# Patient Record
Sex: Female | Born: 1935 | Hispanic: No | Marital: Single | Smoking: Never smoker
Health system: Southern US, Community
[De-identification: ages and names within clinical notes are randomized; demographics above are authoritative.]

---

## 2012-03-04 DIAGNOSIS — R6889 Other general symptoms and signs: Secondary | ICD-10-CM | POA: Diagnosis not present

## 2012-03-04 DIAGNOSIS — R51 Headache: Secondary | ICD-10-CM | POA: Diagnosis not present

## 2012-03-04 DIAGNOSIS — M542 Cervicalgia: Secondary | ICD-10-CM | POA: Diagnosis not present

## 2012-03-05 ENCOUNTER — Emergency Department (HOSPITAL_COMMUNITY): Payer: No Typology Code available for payment source

## 2012-03-05 ENCOUNTER — Encounter (HOSPITAL_COMMUNITY): Payer: Self-pay | Admitting: Neurology

## 2012-03-05 ENCOUNTER — Emergency Department (HOSPITAL_COMMUNITY)
Admission: EM | Admit: 2012-03-05 | Discharge: 2012-03-05 | Disposition: A | Discharge status: Home / Self Care | Payer: No Typology Code available for payment source | Attending: Emergency Medicine | Admitting: Emergency Medicine

## 2012-03-05 DIAGNOSIS — M79609 Pain in unspecified limb: Secondary | ICD-10-CM | POA: Diagnosis not present

## 2012-03-05 DIAGNOSIS — M25519 Pain in unspecified shoulder: Secondary | ICD-10-CM | POA: Diagnosis not present

## 2012-03-05 DIAGNOSIS — R109 Unspecified abdominal pain: Secondary | ICD-10-CM | POA: Insufficient documentation

## 2012-03-05 DIAGNOSIS — T148XXA Other injury of unspecified body region, initial encounter: Secondary | ICD-10-CM | POA: Diagnosis not present

## 2012-03-05 DIAGNOSIS — Y92410 Unspecified street and highway as the place of occurrence of the external cause: Secondary | ICD-10-CM | POA: Insufficient documentation

## 2012-03-05 DIAGNOSIS — R51 Headache: Secondary | ICD-10-CM | POA: Insufficient documentation

## 2012-03-05 DIAGNOSIS — M542 Cervicalgia: Secondary | ICD-10-CM | POA: Diagnosis not present

## 2012-03-05 MED ORDER — HYDROCODONE-ACETAMINOPHEN 5-500 MG PO TABS: 1.0000 | ORAL_TABLET | Freq: Four times a day (QID) | ORAL | Status: AC | PRN

## 2012-03-05 MED ORDER — OXYCODONE-ACETAMINOPHEN 5-325 MG PO TABS
2.0000 | ORAL_TABLET | Freq: Once | ORAL | Status: AC
  Administered 2012-03-05: 2 via ORAL
  Filled 2012-03-05: qty 2
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
## 2012-03-05 NOTE — ED Notes (Signed)
Pt ambulated with steady slow gait.
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
## 2012-03-05 NOTE — ED Notes (Signed)
Patient transported from X-ray and CT 

## 2012-03-05 NOTE — ED Notes (Signed)
Patient transported to CT 

## 2012-04-02 ENCOUNTER — Ambulatory Visit (INDEPENDENT_AMBULATORY_CARE_PROVIDER_SITE_OTHER): Payer: Medicare Other | Admitting: Internal Medicine

## 2012-04-02 ENCOUNTER — Encounter: Payer: Self-pay | Admitting: Internal Medicine

## 2012-04-02 VITALS — BP 126/75 | HR 65 | Temp 97.9°F | Ht 58.5 in | Wt 120.4 lb

## 2012-04-02 DIAGNOSIS — R5381 Other malaise: Secondary | ICD-10-CM

## 2012-04-02 DIAGNOSIS — M779 Enthesopathy, unspecified: Secondary | ICD-10-CM | POA: Diagnosis not present

## 2012-04-02 DIAGNOSIS — H259 Unspecified age-related cataract: Secondary | ICD-10-CM

## 2012-04-02 DIAGNOSIS — M67919 Unspecified disorder of synovium and tendon, unspecified shoulder: Secondary | ICD-10-CM

## 2012-04-02 DIAGNOSIS — M7581 Other shoulder lesions, right shoulder: Secondary | ICD-10-CM | POA: Insufficient documentation

## 2012-04-02 DIAGNOSIS — R5383 Other fatigue: Secondary | ICD-10-CM

## 2012-04-02 DIAGNOSIS — Z0000 Encounter for general adult medical examination without abnormal findings: Secondary | ICD-10-CM | POA: Diagnosis not present

## 2012-04-02 DIAGNOSIS — Z136 Encounter for screening for cardiovascular disorders: Secondary | ICD-10-CM

## 2012-04-02 LAB — CBC
Hemoglobin: 12.7 g/dL (ref 12.0–15.0)
MCHC: 33 g/dL (ref 30.0–36.0)
MCV: 86.7 fL (ref 78.0–100.0)
RBC: 4.44 MIL/uL (ref 3.87–5.11)
WBC: 4.4 10*3/uL (ref 4.0–10.5)

## 2012-04-02 MED ORDER — IBUPROFEN 600 MG PO TABS: 600.0000 mg | ORAL_TABLET | Freq: Four times a day (QID) | ORAL | Status: DC | PRN

## 2012-04-02 MED ORDER — CYCLOBENZAPRINE HCL 5 MG PO TABS: 5.0000 mg | ORAL_TABLET | Freq: Three times a day (TID) | ORAL | Status: DC | PRN

## 2012-04-02 NOTE — Progress Notes (Signed)
INTERNAL MEDICINE TEACHING ATTENDING ADDENDUM Lonzo Cloud , MD: I personally saw and evaluated Ms.Carattini  in this clinic visit in conjunction with the resident, Dr. Burtis Junes. I have discussed the patient's plan of care with Dr. Burtis Junes during this visit. I have confirmed the physical exam findings and have read and agree with the clinic note including the plan.
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
## 2012-04-03 LAB — LIPID PANEL
HDL: 60 mg/dL (ref >39)
LDL Cholesterol: 130 mg/dL — ABNORMAL HIGH (ref 0–99)
VLDL: 12 mg/dL (ref 0–40)

## 2012-04-03 LAB — COMPLETE METABOLIC PANEL WITH GFR
ALT: 13 U/L (ref 0–35)
BUN: 17 mg/dL (ref 6–23)
CO2: 28 mEq/L (ref 19–32)
Chloride: 102 mEq/L (ref 96–112)
GFR, Est African American: >89 mL/min
GFR, Est Non African American: 84 mL/min
Glucose, Bld: 81 mg/dL (ref 70–99)
Potassium: 5.4 mEq/L — ABNORMAL HIGH (ref 3.5–5.3)
Sodium: 138 mEq/L (ref 135–145)
Total Bilirubin: 0.3 mg/dL (ref 0.3–1.2)
Total Protein: 6.9 g/dL (ref 6.0–8.3)

## 2012-04-03 LAB — TSH: TSH: 1.515 u[IU]/mL (ref 0.350–4.500)

## 2012-06-08 ENCOUNTER — Ambulatory Visit: Payer: Medicare Other

## 2012-06-22 ENCOUNTER — Ambulatory Visit: Payer: Medicare Other | Attending: Internal Medicine | Admitting: Physical Therapy

## 2012-06-22 DIAGNOSIS — IMO0001 Reserved for inherently not codable concepts without codable children: Secondary | ICD-10-CM | POA: Insufficient documentation

## 2012-06-22 DIAGNOSIS — M542 Cervicalgia: Secondary | ICD-10-CM | POA: Diagnosis not present

## 2012-06-22 DIAGNOSIS — M546 Pain in thoracic spine: Secondary | ICD-10-CM | POA: Diagnosis not present

## 2012-06-25 ENCOUNTER — Ambulatory Visit: Payer: Medicare Other | Admitting: Physical Therapy

## 2012-06-29 ENCOUNTER — Ambulatory Visit: Payer: Medicare Other | Admitting: Physical Therapy

## 2012-07-01 ENCOUNTER — Ambulatory Visit: Payer: Medicare Other | Admitting: Physical Therapy

## 2012-07-13 ENCOUNTER — Ambulatory Visit: Payer: Medicare Other | Admitting: Physical Therapy

## 2012-07-13 ENCOUNTER — Encounter: Payer: Medicare Other | Admitting: Physical Therapy

## 2012-07-16 ENCOUNTER — Ambulatory Visit (INDEPENDENT_AMBULATORY_CARE_PROVIDER_SITE_OTHER): Payer: Medicare Other | Admitting: Internal Medicine

## 2012-07-16 ENCOUNTER — Encounter: Payer: Self-pay | Admitting: Internal Medicine

## 2012-07-16 VITALS — BP 135/75 | HR 76 | Temp 96.9°F | Ht <= 58 in | Wt 123.1 lb

## 2012-07-16 DIAGNOSIS — E785 Hyperlipidemia, unspecified: Secondary | ICD-10-CM

## 2012-07-16 DIAGNOSIS — Z0000 Encounter for general adult medical examination without abnormal findings: Secondary | ICD-10-CM | POA: Diagnosis not present

## 2012-07-16 DIAGNOSIS — IMO0002 Reserved for concepts with insufficient information to code with codable children: Secondary | ICD-10-CM

## 2012-07-16 DIAGNOSIS — Z23 Encounter for immunization: Secondary | ICD-10-CM

## 2012-07-16 DIAGNOSIS — S46912A Strain of unspecified muscle, fascia and tendon at shoulder and upper arm level, left arm, initial encounter: Secondary | ICD-10-CM

## 2012-07-16 MED ORDER — ATORVASTATIN CALCIUM 10 MG PO TABS: 10.0000 mg | ORAL_TABLET | Freq: Every day | ORAL | Status: DC

## 2012-07-16 MED ORDER — IBUPROFEN 600 MG PO TABS: 600.0000 mg | ORAL_TABLET | Freq: Four times a day (QID) | ORAL | Status: DC | PRN

## 2012-07-16 MED ORDER — CYCLOBENZAPRINE HCL 5 MG PO TABS: 5.0000 mg | ORAL_TABLET | Freq: Three times a day (TID) | ORAL | Status: DC | PRN

## 2012-07-16 NOTE — Addendum Note (Signed)
Addended by: Carolan Clines on: 07/16/2012 02:13 PM   Modules accepted: Level of Service
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
## 2012-07-20 ENCOUNTER — Ambulatory Visit: Payer: Medicare Other | Attending: Internal Medicine | Admitting: Physical Therapy

## 2012-07-20 DIAGNOSIS — M542 Cervicalgia: Secondary | ICD-10-CM | POA: Insufficient documentation

## 2012-07-20 DIAGNOSIS — M546 Pain in thoracic spine: Secondary | ICD-10-CM | POA: Diagnosis not present

## 2012-07-20 DIAGNOSIS — IMO0001 Reserved for inherently not codable concepts without codable children: Secondary | ICD-10-CM | POA: Insufficient documentation

## 2012-07-27 ENCOUNTER — Ambulatory Visit: Payer: Medicare Other | Admitting: Physical Therapy

## 2012-07-27 DIAGNOSIS — R51 Headache: Secondary | ICD-10-CM | POA: Diagnosis not present

## 2012-07-27 DIAGNOSIS — R079 Chest pain, unspecified: Secondary | ICD-10-CM | POA: Diagnosis not present

## 2014-02-01 DIAGNOSIS — M25569 Pain in unspecified knee: Secondary | ICD-10-CM | POA: Diagnosis not present

## 2014-04-26 DIAGNOSIS — G4489 Other headache syndrome: Secondary | ICD-10-CM | POA: Diagnosis not present

## 2014-04-26 DIAGNOSIS — S138XXA Sprain of joints and ligaments of other parts of neck, initial encounter: Secondary | ICD-10-CM | POA: Diagnosis not present

## 2014-05-09 ENCOUNTER — Emergency Department (HOSPITAL_COMMUNITY)
Admission: EM | Admit: 2014-05-09 | Discharge: 2014-05-10 | Disposition: A | Discharge status: Home / Self Care | Payer: Medicare Other | Attending: Emergency Medicine | Admitting: Emergency Medicine

## 2014-05-09 ENCOUNTER — Encounter (HOSPITAL_COMMUNITY): Payer: Self-pay | Admitting: Emergency Medicine

## 2014-05-09 DIAGNOSIS — M546 Pain in thoracic spine: Secondary | ICD-10-CM | POA: Insufficient documentation

## 2014-05-09 DIAGNOSIS — M545 Low back pain: Secondary | ICD-10-CM | POA: Insufficient documentation

## 2014-05-09 DIAGNOSIS — R51 Headache: Secondary | ICD-10-CM | POA: Diagnosis not present

## 2014-05-09 DIAGNOSIS — M549 Dorsalgia, unspecified: Secondary | ICD-10-CM | POA: Diagnosis not present

## 2014-05-09 DIAGNOSIS — M542 Cervicalgia: Secondary | ICD-10-CM | POA: Diagnosis not present

## 2014-05-09 DIAGNOSIS — H269 Unspecified cataract: Secondary | ICD-10-CM | POA: Insufficient documentation

## 2014-05-09 DIAGNOSIS — G8911 Acute pain due to trauma: Secondary | ICD-10-CM | POA: Diagnosis not present

## 2014-05-09 DIAGNOSIS — R519 Headache, unspecified: Secondary | ICD-10-CM

## 2014-05-09 DIAGNOSIS — S3992XA Unspecified injury of lower back, initial encounter: Secondary | ICD-10-CM | POA: Diagnosis not present

## 2014-05-09 DIAGNOSIS — S299XXA Unspecified injury of thorax, initial encounter: Secondary | ICD-10-CM | POA: Diagnosis not present

## 2014-05-09 DIAGNOSIS — W19XXXA Unspecified fall, initial encounter: Secondary | ICD-10-CM

## 2014-05-09 DIAGNOSIS — I10 Essential (primary) hypertension: Secondary | ICD-10-CM | POA: Diagnosis not present
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
## 2014-05-10 ENCOUNTER — Emergency Department (HOSPITAL_COMMUNITY): Payer: Medicare Other

## 2014-05-10 DIAGNOSIS — M549 Dorsalgia, unspecified: Secondary | ICD-10-CM | POA: Diagnosis not present

## 2014-05-10 DIAGNOSIS — M546 Pain in thoracic spine: Secondary | ICD-10-CM | POA: Diagnosis not present

## 2014-05-10 DIAGNOSIS — S3992XA Unspecified injury of lower back, initial encounter: Secondary | ICD-10-CM | POA: Diagnosis not present

## 2014-05-10 DIAGNOSIS — S299XXA Unspecified injury of thorax, initial encounter: Secondary | ICD-10-CM | POA: Diagnosis not present

## 2014-05-10 DIAGNOSIS — R51 Headache: Secondary | ICD-10-CM | POA: Diagnosis not present

## 2014-05-10 MED ORDER — TRAMADOL HCL 50 MG PO TABS: 50.0000 mg | ORAL_TABLET | Freq: Four times a day (QID) | ORAL | Status: DC | PRN

## 2014-05-10 MED ORDER — OXYCODONE-ACETAMINOPHEN 5-325 MG PO TABS
1.0000 | ORAL_TABLET | Freq: Once | ORAL | Status: AC
  Administered 2014-05-10: 1 via ORAL
  Filled 2014-05-10: qty 1

## 2014-05-10 NOTE — ED Provider Notes (Signed)
CSN: [id number]     Arrival date & time 05/09/14  2150 History   First MD Initiated Contact with Patient 05/10/14 0004     Chief Complaint  Patient presents with  . Headache  . Back Pain     (Consider location/radiation/quality/duration/timing/severity/associated sxs/prior Treatment) HPI Interpreter used, [age] year old female [facility] weeks ago slipped on a wet floor did not fall but has had gradual onset constant headache and neck pain and back pain since that time, [facility] [place] [facility] for the same pain as well as urgent [place], she has been using Tylenol and ibuprofen without improvement of her pain, she has no weakness no change in bowel or bladder function no change in speech or vision or swallowing and no numbness except that for the last 2 mornings when she first woke up for just a few minutes she felt as if both hands might be slightly numb but that has not recurred throughout the day at all, her pain is to the posterior aspect of her head neck and entire back with radiation of her back pain down her legs to her feet 24 hours a day worse with palpation and movement. She has no fever no cough no chest pain no shortness of breath no abdominal pain and no other concerns. All of her pains are gradual onset moderately severe. Past Medical History  Diagnosis Date  . Cataract    History reviewed. No pertinent past surgical history. Family History  Problem Relation Age of Onset  . Hypertension Mother    History  Substance Use Topics  . Smoking status: Never Smoker   . Smokeless tobacco: Not on file  . Alcohol Use: No   OB History    No data available     Review of Systems 10 Systems reviewed and are negative for acute change except as noted in the HPI.   Allergies  Review of patient's allergies indicates no known allergies.  Home Medications   Prior to Admission medications   Medication Sig Start Date End Date Taking?  Authorizing Provider  acetaminophen (TYLENOL) 325 MG tablet Take 650 mg by mouth every 6 (six) hours as needed.   Yes Historical Provider, MD  traMADol (ULTRAM) 50 MG tablet Take 1 tablet (50 mg total) by mouth every 6 (six) hours as needed for severe pain. 05/10/14   Hurman HornJohn M Venesa Semidey, MD   BP 130/67 mmHg  Pulse 60  Temp(Src) 98.8 F (37.1 C) (Oral)  Resp 21  SpO2 99% Physical Exam  Nursing note and vitals reviewed. Constitutional:  Awake, alert, nontoxic appearance with baseline speech for patient.  HENT:  Head: Atraumatic.  Mouth/Throat: No oropharyngeal exudate.  Eyes: EOM are normal. Pupils are equal, round, and reactive to light. Right eye exhibits no discharge. Left eye exhibits no discharge.  Neck: Neck supple.  Diffusely tender posterior neck including cervical spine as well as entire back over thoracic spine and lumbar spine and parathoracic region paralumbar region  Cardiovascular: Normal rate and regular rhythm.   No murmur heard. Pulmonary/Chest: Effort normal and breath sounds normal. No stridor. No respiratory distress. She has no wheezes. She has no rales. She exhibits no tenderness.  Abdominal: Soft. Bowel sounds are normal. She exhibits no mass. There is no tenderness. There is no rebound.  Musculoskeletal: She exhibits no tenderness.  Baseline ROM, moves extremities with no obvious new focal weakness.  Lymphadenopathy:    She has no cervical adenopathy.  Neurological: She is alert.  Awake, alert, cooperative and aware of situation; motor strength 5/5 bilaterally; sensation normal to light touch bilaterally; peripheral visual fields full to confrontation; no facial asymmetry; tongue midline; major cranial nerves appear intact; no pronator drift, normal finger to nose bilaterally, baseline gait without new ataxia.  Skin: No rash noted.  Psychiatric: She has a normal mood and affect.    ED Course  Procedures (including critical care time) CT C-Spine no fracture  [id number] in [place] [facility]. Head CT and T/L plain films ordered. 0200   Labs Review Labs Reviewed - No data to display  Imaging Review No results found.   EKG Interpretation None      MDM   Final diagnoses:  Fall  Headache  Back pain  Neck pain    Patient / Family / Caregiver informed of clinical course, understand medical decision-making process, and agree with plan. I doubt any other EMC precluding discharge at this time including, but not necessarily limited to the following:ICH, cord syndrome.    Hurman HornJohn M Juanantonio Stolar, MD 05/14/14 [phone]

## 2014-05-10 NOTE — Discharge Instructions (Signed)
You have neck pain, possibly from a cervical strain and/or pinched nerve.  SEEK IMMEDIATE MEDICAL ATTENTION IF: You develop difficulties swallowing or breathing.  You have new or worse numbness, weakness, tingling, or movement problems in your arms or legs.  You develop increasing pain which is uncontrolled with medications.  You have change in bowel or bladder function, or other concerns.  SEEK IMMEDIATE MEDICAL ATTENTION IF: New numbness, tingling, weakness, or problem with the use of your arms or legs.  Severe back pain not relieved with medications.  Change in bowel or bladder control.  Increasing pain in any areas of the body (such as chest or abdominal pain).  Shortness of breath, dizziness or fainting.  Nausea (feeling sick to your stomach), vomiting, fever, or sweats.  You are having a headache. No specific cause was found today for your headache. It may have been a migraine or other cause of headache. Stress, anxiety, fatigue, and depression are common triggers for headaches. Your headache today does not appear to be life-threatening or require hospitalization, but often the exact cause of headaches is not determined in the emergency department. Therefore, follow-up with your doctor is very important to find out what may have caused your headache, and whether or not you need any further diagnostic testing or treatment. Sometimes headaches can appear benign (not harmful), but then more serious symptoms can develop which should prompt an immediate re-evaluation by your doctor or the emergency department. SEEK MEDICAL ATTENTION IF: You develop possible problems with medications prescribed.  The medications don't resolve your headache, if it recurs , or if you have multiple episodes of vomiting or can't take fluids. You have a change from the usual headache. RETURN IMMEDIATELY IF you develop a sudden, severe headache or confusion, become poorly responsive or faint, develop a fever above  100.41F or problem breathing, have a change in speech, vision, swallowing, or understanding, or develop new weakness, numbness, tingling, incoordination, or have a seizure.  Emergency Department Resource Guide 1) Find a Doctor and Pay Out of Pocket Although you won't have to find out who is covered by your insurance plan, it is a good idea to ask around and get recommendations. You will then need to call the office and see if the doctor you have chosen will accept you as a new patient and what types of options they offer for patients who are self-pay. Some doctors offer discounts or will set up payment plans for their patients who do not have insurance, but you will need to ask so you aren't surprised when you get to your appointment.  2) Contact Your Local Health Department Not all health departments have doctors that can see patients for sick visits, but many do, so it is worth a call to see if yours does. If you don't know where your local health department is, you can check in your phone book. The CDC also has a tool to help you locate your state's health department, and many state websites also have listings of all of their local health departments.  3) Find a Walk-in Clinic If your illness is not likely to be very severe or complicated, you may want to try a walk in clinic. These are popping up all over the country in pharmacies, drugstores, and shopping centers. They're usually staffed by nurse practitioners or physician assistants that have been trained to treat common illnesses and complaints. They're usually fairly quick and inexpensive. However, if you have serious medical issues or chronic medical problems,  these are probably not your best option.  No Primary Care Doctor: - Call Health Connect at  [phone] - they can help you locate a primary care doctor that  accepts your insurance, provides certain services, etc. - Physician Referral Service- [phone]  Chronic Pain  Problems: Organization         Address  Phone   Notes  [facility]  [phone] Patients need to be referred by their primary care doctor.   Medication Assistance: Organization         Address  Phone   Notes  [facility] Medication [facility] [address] [place], [place] [address] [phone] --Must be a resident of [facility] -- Must have NO insurance coverage whatsoever (no Medicaid/ Medicare, etc.) -- The pt. MUST have a primary care doctor that directs their care regularly and follows them in the community   [facility]ist  [phone]   [employer]  [phone]    Agencies that provide inexpensive medical care: Organization         Address  Phone   Notes  [facility]  [phone]   [facility]    [phone]   [facility] [address] [place], [place] [address] [phone]   [facility] [address] [place] [place] [phone]   Planned Parenthood    [phone]   [facility]    [phone]   [facility]  [address], [place] Phone:  [phone], Fax:  [phone] Hours of Operation:  9 am - 6 pm, M-F.  Also accepts Medicaid/Medicare and self-pay.  [facility]  [address], [place] Phone: [phone], Fax: [phone]. Hours of Operation:  8:30 am - 5:30 pm, M-F.  Also accepts Medicaid and self-pay.  [facility] [place] [address] [place] Phone: [phone]   [facility] [address] Natasha Bence [place], [place] [phone], Ext. [phone] Mondays & Thursdays: 7-9 AM.  First 15 patients are seen on a first come, first serve basis.    Medicaid-accepting [facility] Providers:  Organization         Address  Phone   Notes  [facility] [address],  [phone] Also  accepts self-pay patients.  [facility] [address] Laurell Josephs [place], [place]  [phone]   [facility] [address], [place] [phone]   [facility] [address] [place] [phone]   Renaye Rakers [address], [place]   [phone] Only accepts [place] patients after they have their name applied to their card.   Self-Pay (no insurance) in [facility]:  Organization         Address  Phone   Notes  Sickle Cell Patients, [facility] [address] [place], [place] [phone]   [facility] [address] [place], [place] [phone]   [facility] [place]  [address], [place] [phone]   [facility]/Dr. Osei-Bonsu  [address] [place] or [address], [place] [phone] Phone number for both [place] and [place] locations is the same.  Urgent Medical and  [facility] [address] [place] [phone]   [facility] [address], [place] or [address] [phone]   [facility] [address], [place] [phone], phone; [phone], fax Sees patients 1st and 3rd Saturday of every month.  Must not qualify for public or private insurance (i.e. Medicaid, Medicare, [place] Health Choice, Veterans' Benefits)  Household income should be no more than 200% of the poverty level The clinic cannot treat you if you are pregnant or think you are pregnant  Sexually transmitted diseases are not treated at the clinic.   Dental Care: Organization         Address  Phone  Notes  [facility] Department of [facility] [address] [place], [place] [phone] Accepts children up to age 75 who are enrolled in [place] or [place] Health Choice; pregnant  women with a Medicaid card; and children who have applied for Medicaid or [place] Health Choice, but were declined, whose parents can pay a reduced fee at time of service.  [facility]  [address], [place] [phone] Accepts children up to age 70 who are enrolled in [place] or [place] Health Choice; pregnant women with a Medicaid card; and children who have applied for Medicaid or [place] Health Choice, but were declined, whose parents can pay a reduced fee at time of service.  [facility]  [address] [place], [place] [phone] Patients are seen by appointment only. Walk-ins are not accepted. [facility] will see patients 25 years of age and older. Monday - Tuesday (8am-5pm) Most Wednesdays (8:30-5pm) $30 per visit, cash only  [facility]  [address], [facility] [phone] Patients are seen by appointment only. Walk-ins are not accepted. [facility] will see patients 50 years of age and older. One Wednesday Evening (Monthly: Volunteer Based).  $30 per visit, cash only  [employer]  [phone] for adults; Children under age 41, call Graduate Pediatric Dentistry at [phone]. Children aged 96-14, please call [phone] to request a pediatric application.  Dental services are provided in all areas of dental care including fillings, crowns and bridges, complete and partial dentures, implants, gum treatment, root canals, and extractions. Preventive care is also provided. Treatment is provided to both adults and children. Patients are selected via a lottery and there is often a waiting list.   [facility] [address] [place]  [phone] [email or web address]   [facility] [address] [place], [place] [phone], Ext. [phone] Second and Fourth Thursday of each month, opens at 6:30 AM; Clinic ends at 9 AM.  Patients are  seen on a first-come first-served basis, and a limited number are seen during each clinic.   [facility]  [address] Ether Griffins [place], [place] [phone]   Eligibility Requirements You must have lived in [place], [place], or [place] counties for at least the last three months.   You cannot be eligible for state or federal sponsored [place], including [employer], [place], or [employer].   You generally cannot be eligible for healthcare insurance through your employer.    How to apply: Eligibility screenings are held every Tuesday and Wednesday afternoon from 1:00 pm until 4:00 pm. You do not need an appointment for the interview!  [facility]  Clinic [address], [place], [place] [phone]   [facility]  [phone]   [facility]  [phone]   [facility]  [phone]    Behavioral Health Resources in the Community: Intensive Outpatient Programs Organization         Address  Phone  Notes  [facility] [address], [place], [place] [phone]   [facility] Outpatient [address], [place], [place] [phone]   [facility] [address], [place], [place]  [phone]   [facility] [address],  [place], [place] [phone] or [phone]   Substance Abuse Resources Organization         Address  Phone  Notes  [facility]  [phone]   [facility]  [phone]   [facility]  [phone]   [facility]  [phone]   Residential & Outpatient Substance Abuse Program  [phone]   Psychological Services Organization         Address  Phone  Notes  [facility]  [phone]   [facility]  [phone]- [phone]   [facility] [address], [place] [phone] or [phone]    Mobile Crisis  Teams Organization         Address  Phone  Notes  [facility], Mobile Crisis Care Unit  [phone]   [facility]  [address] [place], [place] [phone]   Doristine LocksSharon DeEsch [address] [place] [phone]    Self-Help/Support Groups Organization         Address  Phone             Notes  [facility] [place] - variety of support groups  [phone] Call for more information  Narcotics Anonymous (NA), Caring [facility] [address], [place]  2 meetings at this location   Statisticianesidential Treatment Programs Organization         Address  Phone  Notes  [facility] [address],    [place]  [phone]   [facility]  [address], [place] [phone], [place], [place] [phone]   [facility] [address], [place] [phone] Admissions: 8am-3pm M-F  [facility] [address]    [place], [place] [phone]   [facility] [address], [place], [place] [phone]   [facility] [address]  [place], [place] [phone]   [facility] - Intensive Outpatient [address] Josephs[address], [place], [place] [phone]   [facility] ([facility] [address]  [place], [place] [phone] or [phone]   [facility] (RT[facility]) [address] [place], [place] [phone] Accepts Medicaid  [facility] [address]  [place] [phone] Substance Abuse/Addiction Treatment   [facility] Resources Organization         Address  Phone  Notes  [facility]  [phone]) [phone]   Angie FavaJulie Brannon, PhD [address] [address] [place], [place]   [phone](336) [phone] or [phone]   [facility]   [address] [place], [place] [phone]   [facility] [address], [place], [place] [phone](336) [phone]  Insurance/Medicaid/sponsorship through [employer]  [facility] Families [address]  [place], [place] [phone] Therapy/tele-psych/case  [facility] [address]   [place], [place] [phone]    Dr. Lolly MustacheArfeen  [phone]   [facility]  [employer] [facility] 1) [address], [place] 2) [address], [place] 3)  [address], [place] [phone]  [phone]   [facility] Child Abuse Hotline [phone]) [phone] or [phone]) [phone] (After Hours)

## 2014-07-06 DIAGNOSIS — R079 Chest pain, unspecified: Secondary | ICD-10-CM | POA: Diagnosis not present

## 2014-08-28 DIAGNOSIS — M549 Dorsalgia, unspecified: Secondary | ICD-10-CM | POA: Diagnosis not present

## 2014-08-28 DIAGNOSIS — M2550 Pain in unspecified joint: Secondary | ICD-10-CM | POA: Diagnosis not present

## 2014-08-28 DIAGNOSIS — M25562 Pain in left knee: Secondary | ICD-10-CM | POA: Diagnosis not present

## 2014-08-28 DIAGNOSIS — M25561 Pain in right knee: Secondary | ICD-10-CM | POA: Diagnosis not present

## 2014-08-29 DIAGNOSIS — M2550 Pain in unspecified joint: Secondary | ICD-10-CM | POA: Diagnosis not present

## 2014-08-30 DIAGNOSIS — M25562 Pain in left knee: Secondary | ICD-10-CM | POA: Diagnosis not present

## 2014-08-30 DIAGNOSIS — M549 Dorsalgia, unspecified: Secondary | ICD-10-CM | POA: Diagnosis not present

## 2015-02-21 DIAGNOSIS — R109 Unspecified abdominal pain: Secondary | ICD-10-CM | POA: Diagnosis not present

## 2015-03-06 DIAGNOSIS — R079 Chest pain, unspecified: Secondary | ICD-10-CM | POA: Diagnosis not present

## 2015-03-27 DIAGNOSIS — Z135 Encounter for screening for eye and ear disorders: Secondary | ICD-10-CM | POA: Diagnosis not present

## 2015-03-27 DIAGNOSIS — M810 Age-related osteoporosis without current pathological fracture: Secondary | ICD-10-CM | POA: Diagnosis not present

## 2015-03-27 DIAGNOSIS — E785 Hyperlipidemia, unspecified: Secondary | ICD-10-CM | POA: Diagnosis not present

## 2015-03-27 DIAGNOSIS — Z23 Encounter for immunization: Secondary | ICD-10-CM | POA: Diagnosis not present

## 2015-03-27 DIAGNOSIS — Z0000 Encounter for general adult medical examination without abnormal findings: Secondary | ICD-10-CM | POA: Diagnosis not present

## 2015-03-27 DIAGNOSIS — Z13220 Encounter for screening for lipoid disorders: Secondary | ICD-10-CM | POA: Diagnosis not present

## 2015-04-26 ENCOUNTER — Other Ambulatory Visit: Payer: Self-pay | Admitting: Family Medicine

## 2015-04-26 DIAGNOSIS — E2839 Other primary ovarian failure: Secondary | ICD-10-CM

## 2015-04-26 DIAGNOSIS — R079 Chest pain, unspecified: Secondary | ICD-10-CM | POA: Diagnosis not present

## 2015-05-03 DIAGNOSIS — Z961 Presence of intraocular lens: Secondary | ICD-10-CM | POA: Diagnosis not present

## 2015-06-24 DIAGNOSIS — R079 Chest pain, unspecified: Secondary | ICD-10-CM | POA: Diagnosis not present

## 2017-01-05 ENCOUNTER — Emergency Department (HOSPITAL_COMMUNITY): Payer: Medicare Other

## 2017-01-05 ENCOUNTER — Encounter (HOSPITAL_COMMUNITY): Payer: Self-pay | Admitting: Nurse Practitioner

## 2017-01-05 ENCOUNTER — Emergency Department (HOSPITAL_COMMUNITY)
Admission: EM | Admit: 2017-01-05 | Discharge: 2017-01-05 | Disposition: A | Discharge status: Home / Self Care | Payer: Medicare Other | Attending: Emergency Medicine | Admitting: Emergency Medicine

## 2017-01-05 DIAGNOSIS — M542 Cervicalgia: Secondary | ICD-10-CM | POA: Diagnosis not present

## 2017-01-05 DIAGNOSIS — S0083XA Contusion of other part of head, initial encounter: Secondary | ICD-10-CM | POA: Insufficient documentation

## 2017-01-05 DIAGNOSIS — Y9289 Other specified places as the place of occurrence of the external cause: Secondary | ICD-10-CM | POA: Diagnosis not present

## 2017-01-05 DIAGNOSIS — S0990XA Unspecified injury of head, initial encounter: Secondary | ICD-10-CM

## 2017-01-05 DIAGNOSIS — R51 Headache: Secondary | ICD-10-CM | POA: Diagnosis not present

## 2017-01-05 DIAGNOSIS — T1490XA Injury, unspecified, initial encounter: Secondary | ICD-10-CM

## 2017-01-05 DIAGNOSIS — Y999 Unspecified external cause status: Secondary | ICD-10-CM | POA: Diagnosis not present

## 2017-01-05 DIAGNOSIS — Y9389 Activity, other specified: Secondary | ICD-10-CM | POA: Diagnosis not present

## 2017-01-05 DIAGNOSIS — W228XXA Striking against or struck by other objects, initial encounter: Secondary | ICD-10-CM | POA: Diagnosis not present

## 2017-01-05 DIAGNOSIS — S199XXA Unspecified injury of neck, initial encounter: Secondary | ICD-10-CM | POA: Diagnosis not present

## 2017-01-05 MED ORDER — ACETAMINOPHEN 325 MG PO TABS
650.0000 mg | ORAL_TABLET | Freq: Four times a day (QID) | ORAL | 0 refills | Status: DC | PRN
Start: 2017-01-05 — End: 2017-01-09

## 2017-01-05 NOTE — Discharge Instructions (Signed)
Your CTs does not show serious head or neck injury. This is likely bruising and muscle pain. Take tylenol as needed for pain. Return for worsening symptoms, including confusion, difficulty walking, intractable vomiting, escalating pain or any other symptoms concerning to you.

## 2017-01-05 NOTE — ED Provider Notes (Signed)
[facility]-EMERGENCY DEPT Provider Note   CSN: [id number] Arrival date & time: 01/05/17  1538   By signing my name below, I, Clarisse GougeXavier Herndon, attest that this documentation has been prepared under the direction and in the presence of Verdie MosherLiu, Neysa Bonitoana Duo, MD. Electronically signed, Clarisse GougeXavier Herndon, ED Scribe. 01/05/17. 6:56 PM.   History   Chief Complaint Chief Complaint  Patient presents with  . Head Injury   The history is provided by the patient, medical records, a friend and a relative. A language interpreter was used (Friend and family acting as interpretors.).  Head Injury   The incident occurred yesterday. She came to the ER via walk-in. The injury mechanism was a direct blow. There was no loss of consciousness. There was no blood loss. The quality of the pain is described as dull. The pain is at a severity of 7/10. The pain is moderate. The pain has been constant since the injury. Pertinent negatives include no numbness, no blurred vision, no vomiting, no disorientation, no weakness and no memory loss. She has tried nothing for the symptoms.    Olivia Hartman is a 83 y.o. female presenting to the Emergency Department concerning a painful, raised area to the forehead that she noticed upon waking this morning. Friend and family available to translate. Pt allegedly hit the top of her head on a refrigerator door last night. No LOC; no known trauma to affected area. She describes 7/10, constant aching pain worse with contact and radiating down the L side of the head and into the neck. No PTA medications noted. No vision changes, N/V, vision changes, speech changes, numbness, weakness, confusion.  Past Medical History:  Diagnosis Date  . Cataract     Patient Active Problem List   Diagnosis Date Noted  . Healthcare maintenance 04/02/2012  . Tendonitis of shoulder, right 04/02/2012    History reviewed. No pertinent surgical history.  OB History    No data available       Home Medications     Prior to Admission medications   Medication Sig Start Date End Date Taking? Authorizing Provider  acetaminophen (TYLENOL) 325 MG tablet Take 650 mg by mouth every 6 (six) hours as needed.    [provider]  acetaminophen (TYLENOL) 325 MG tablet Take 2 tablets (650 mg total) by mouth every 6 (six) hours as needed for mild pain or moderate pain. 01/05/17   Lavera GuiseLiu, Adarian Bur Duo, MD  traMADol (ULTRAM) 50 MG tablet Take 1 tablet (50 mg total) by mouth every 6 (six) hours as needed for severe pain. 05/10/14   Wayland SalinasBednar, John, MD    Family History Family History  Problem Relation Age of Onset  . Hypertension Mother     Social History Social History  Substance Use Topics  . Smoking status: Never Smoker  . Smokeless tobacco: Never Used  . Alcohol use No     Allergies   Patient has no known allergies.   Review of Systems Review of Systems  Eyes: Negative for blurred vision.  Gastrointestinal: Negative for vomiting.  Musculoskeletal:       Positive raised knot to forehead  Skin: Negative for color change and wound.  Neurological: Positive for headaches. Negative for dizziness, speech difficulty, weakness and numbness.  Psychiatric/Behavioral: Negative for behavioral problems, confusion and memory loss.  All other systems reviewed and are negative.    Physical Exam Updated Vital Signs BP 123/76   Pulse 61   Temp 98.7 F (37.1 C) (Oral)   Resp 16  SpO2 99%   Physical Exam Physical Exam  Nursing note and vitals reviewed. Constitutional: Well developed, well nourished, non-toxic, and in no acute distress Head: Normocephalic and small hematoma to the right forehead.  Mouth/Throat: Oropharynx is clear and moist.  Neck: Normal range of motion. Neck supple.  Cardiovascular: Normal rate and regular rhythm.   Pulmonary/Chest: Effort normal and breath sounds normal.  Abdominal: Soft. There is no tenderness. There is no rebound and no guarding.  Musculoskeletal: Normal range of  motion.  Neurological:  Alert, oriented to person, place, time, and situation. Memory grossly in tact. Fluent speech. No dysarthria or aphasia.  Cranial nerves: Pupils are symmetric, and reactive to light. EOMI without nystagmus. No gaze deviation. Facial muscles symmetric with activation. Sensation to light touch over face in tact bilaterally. Hearing grossly in tact. Palate elevates symmetrically. Head turn and shoulder shrug are intact. Tongue midline.  Reflexes defered.  Muscle bulk and tone normal. No pronator drift. Moves all extremities symmetrically. Sensation to light touch is in tact throughout in bilateral upper and lower extremities. Coordination reveals no dysmetria with finger to nose.  Skin: Skin is warm and dry.  Psychiatric: Cooperative   ED Treatments / Results  DIAGNOSTIC STUDIES: Oxygen Saturation is 99% on RA, NL by my interpretation.    COORDINATION OF CARE: 6:55 PM-Discussed next steps with pt. Pt verbalized understanding and is agreeable with the plan. Pt prepared for d/c, advised of symptomatic care at home and return precautions.    Labs (all labs ordered are listed, but only abnormal results are displayed) Labs Reviewed - No data to display  EKG  EKG Interpretation None       Radiology Ct Head Wo Contrast  Result Date: 01/05/2017 CLINICAL DATA:  Headache and neck pain after hitting the head on the top of a refrigerator yesterday. EXAM: CT HEAD WITHOUT CONTRAST CT CERVICAL SPINE WITHOUT CONTRAST TECHNIQUE: Multidetector CT imaging of the head and cervical spine was performed following the standard protocol without intravenous contrast. Multiplanar CT image reconstructions of the cervical spine were also generated. COMPARISON:  Head CT dated 05/10/2014 and head and cervical spine CT dated 03/05/2012. FINDINGS: CT HEAD FINDINGS Brain: Diffusely enlarged ventricles and subarachnoid spaces. Patchy white matter low density in both cerebral hemispheres. Stable  right posterior parietal curvilinear calcific density within a sulcus. No intracranial hemorrhage, mass lesion or CT evidence of acute infarction. Vascular: No hyperdense vessel or unexpected calcification. Skull: Normal. Negative for fracture or focal lesion. Sinuses/Orbits: Status post bilateral cataract extraction. Unremarkable included paranasal sinuses. Other: None. CT CERVICAL SPINE FINDINGS Alignment: Normal. Skull base and vertebrae: No acute fracture. No primary bone lesion or focal pathologic process. Soft tissues and spinal canal: No prevertebral fluid or swelling. No visible canal hematoma. Disc levels:  Unremarkable. Upper chest: Clear lung apices. Other: Mildly heterogeneous thyroid gland. IMPRESSION: 1. No skull fracture or intracranial hemorrhage. 2. No cervical spine fracture or subluxation. 3. Stable mild cerebral and cerebellar atrophy atrophy, chronic small vessel white matter ischemic changes and right parietal sulcus calcification. Electronically Signed   By: Beckie Salts M.D.   On: 01/05/2017 17:09   Ct Cervical Spine Wo Contrast  Result Date: 01/05/2017 CLINICAL DATA:  Headache and neck pain after hitting the head on the top of a refrigerator yesterday. EXAM: CT HEAD WITHOUT CONTRAST CT CERVICAL SPINE WITHOUT CONTRAST TECHNIQUE: Multidetector CT imaging of the head and cervical spine was performed following the standard protocol without intravenous contrast. Multiplanar CT image reconstructions of the  cervical spine were also generated. COMPARISON:  Head CT dated 05/10/2014 and head and cervical spine CT dated 03/05/2012. FINDINGS: CT HEAD FINDINGS Brain: Diffusely enlarged ventricles and subarachnoid spaces. Patchy white matter low density in both cerebral hemispheres. Stable right posterior parietal curvilinear calcific density within a sulcus. No intracranial hemorrhage, mass lesion or CT evidence of acute infarction. Vascular: No hyperdense vessel or unexpected calcification. Skull:  Normal. Negative for fracture or focal lesion. Sinuses/Orbits: Status post bilateral cataract extraction. Unremarkable included paranasal sinuses. Other: None. CT CERVICAL SPINE FINDINGS Alignment: Normal. Skull base and vertebrae: No acute fracture. No primary bone lesion or focal pathologic process. Soft tissues and spinal canal: No prevertebral fluid or swelling. No visible canal hematoma. Disc levels:  Unremarkable. Upper chest: Clear lung apices. Other: Mildly heterogeneous thyroid gland. IMPRESSION: 1. No skull fracture or intracranial hemorrhage. 2. No cervical spine fracture or subluxation. 3. Stable mild cerebral and cerebellar atrophy atrophy, chronic small vessel white matter ischemic changes and right parietal sulcus calcification. Electronically Signed   By: Beckie Salts M.D.   On: 01/05/2017 17:09    Procedures Procedures (including critical care time)  Medications Ordered in ED Medications - No data to display   Initial Impression / Assessment and Plan / ED Course  I have reviewed the triage vital signs and the nursing notes.  Pertinent labs & imaging results that were available during my care of the patient were reviewed by me and considered in my medical decision making (see chart for details).    83 year old female who presents after head injury. She is well-appearing with normal vital signs. Neuro intact. Mentating normally. No anticoagulation. CT head and cervical spine are visualized and shows no acute intracranial processes or traumatic cervical spine injury. Tylenol recommended for pain. Strict return and follow-up instructions reviewed. Family expressed understanding of all discharge instructions and felt comfortable with the plan of care.   Final Clinical Impressions(s) / ED Diagnoses   Final diagnoses:  Injury  Injury of head, initial encounter    New Prescriptions New Prescriptions   ACETAMINOPHEN (TYLENOL) 325 MG TABLET    Take 2 tablets (650 mg total) by mouth  every 6 (six) hours as needed for mild pain or moderate pain.   I personally performed the services described in this documentation, which was scribed in my presence. The recorded information has been reviewed and is accurate.    Lavera Guise, MD 01/05/17 [phone]

## 2017-01-05 NOTE — ED Triage Notes (Addendum)
Pt presents with c/o head injury. She hit the top of her head on refrigerator door last night. She woke this morning with hematoma and pain to her forehead, different site from where she hit the refrigerator. Pain is radiating from hematoma down left side of head into her left neck. She denies loss of consciousness, vision changes, nausea, vomiting. She did not take anything fort the pain at home

## 2017-01-09 ENCOUNTER — Ambulatory Visit (INDEPENDENT_AMBULATORY_CARE_PROVIDER_SITE_OTHER): Payer: Medicare Other | Admitting: Physician Assistant

## 2017-01-09 VITALS — BP 100/63 | HR 64 | Temp 97.8°F | Resp 18 | Ht <= 58 in | Wt 108.6 lb

## 2017-01-09 DIAGNOSIS — M62838 Other muscle spasm: Secondary | ICD-10-CM

## 2017-01-09 DIAGNOSIS — F0781 Postconcussional syndrome: Secondary | ICD-10-CM | POA: Diagnosis not present

## 2017-01-09 DIAGNOSIS — T148XXA Other injury of unspecified body region, initial encounter: Secondary | ICD-10-CM

## 2017-01-09 DIAGNOSIS — R51 Headache: Secondary | ICD-10-CM | POA: Diagnosis not present

## 2017-01-09 DIAGNOSIS — R519 Headache, unspecified: Secondary | ICD-10-CM

## 2017-01-09 MED ORDER — ACETAMINOPHEN 500 MG PO TABS: 500.0000 mg | ORAL_TABLET | Freq: Four times a day (QID) | ORAL | 0 refills | Status: AC | PRN

## 2017-01-09 NOTE — Progress Notes (Signed)
Olivia Hartman  MRN: [id number] DOB: 05/29/1934  Subjective:   Olivia Hartman is a 83 y.o. female who presents for evaluation of headache. Symptoms began about 1 week ago after hitting her head on the refridgerator door. She did not lose consciousness. No use of anticoagulation. She was evaluated by the ED. CT of head and cervical spine obtained and showed no acute intracranial processes or traumatic cervical spine injury.. She was given Rx for tylenol. Has taken it as prescribed but notes it has not helped Today, she complains of continuing headache at the site she hit her head. She has a bruise on her forehead that is bothering her. She is also having neck and shoulder pain as well. She rates the pain as a 6/10. Denies nausea, vomiting, vision changes, speech changes, numbness, weakness, and confusion. She cooks at home, and watches a lot of of TV. Gets about 3 hours of sleep a night. Drinks about 16 oz of water a day.   Pt is accompanied by her sister today, who is interpreting for patient as patient speaks Arabic.   Review of Systems  Constitutional: Negative for chills, diaphoresis and fever.  Eyes: Negative for photophobia.  Respiratory: Negative for shortness of breath.   Cardiovascular: Negative for chest pain and palpitations.  Gastrointestinal: Negative for abdominal pain.  Musculoskeletal: Negative for myalgias.  Skin: Negative for rash.  Neurological: Negative for dizziness, syncope, facial asymmetry and speech difficulty.    Patient Active Problem List   Diagnosis Date Noted  . Healthcare maintenance 04/02/2012  . Tendonitis of shoulder, right 04/02/2012    Current Outpatient Prescriptions on File Prior to Visit  Medication Sig Dispense Refill  . acetaminophen (TYLENOL) 325 MG tablet Take 2 tablets (650 mg total) by mouth every 6 (six) hours as needed for mild pain or moderate pain. 30 tablet 0  . acetaminophen (TYLENOL) 325 MG tablet Take 650 mg by mouth every 6 (six) hours as  needed.    . traMADol (ULTRAM) 50 MG tablet Take 1 tablet (50 mg total) by mouth every 6 (six) hours as needed for severe pain. (Patient not taking: Reported on 01/09/2017) 15 tablet 0   No current facility-administered medications on file prior to visit.     No Known Allergies    Social History   Social History  . Marital status: Single    Spouse name: N/A  . Number of children: N/A  . Years of education: N/A   Occupational History  . Not on file.   Social History Main Topics  . Smoking status: Never Smoker  . Smokeless tobacco: Never Used  . Alcohol use No  . Drug use: No  . Sexual activity: Not on file   Other Topics Concern  . Not on file   Social History Narrative  . No narrative on file    Objective:  BP 100/63   Pulse 64   Temp 97.8 F (36.6 C) (Oral)   Resp 18   Ht 4' 8.5" (1.435 m)   Wt 108 lb 9.6 oz (49.3 kg)   SpO2 100%   BMI 23.92 kg/m   Physical Exam  Constitutional: She is oriented to person, place, and time and well-developed, well-nourished, and in no distress.  HENT:  Head: Normocephalic and atraumatic.  Eyes: Conjunctivae are normal.  Neck: Normal range of motion. Muscular tenderness (muscle spasms noted in bilateral trapezius muscle) present. No spinous process tenderness present. No erythema and normal range of motion present.  Cardiovascular: Normal rate,  regular rhythm and normal heart sounds.   Pulmonary/Chest: Effort normal and breath sounds normal. She has no wheezes. She has no rales.  Musculoskeletal:       Cervical back: She exhibits spasm.       Thoracic back: Normal.       Lumbar back: Normal.  Neurological: She is alert and oriented to person, place, and time. She has normal sensation, normal strength, normal reflexes and intact cranial nerves. She displays facial symmetry and normal speech. She has a normal Finger-Nose-Finger Test, a normal Romberg Test and a normal Tandem Gait Test. Gait normal.  Skin: Skin is warm and dry.      Psychiatric: Affect normal.  Vitals reviewed.  I personally reviewed ED notes from 01/05/17.   Assessment and Plan :  1. Nonintractable headache, unspecified chronicity pattern, unspecified headache type 2. Post concussion syndrome 3. Hematoma 4. Neck muscle spasm Headache is likely due to concussion and hematoma. No acute findings today on neuro exam. Pt encouraged to try complete brain rest for the next few days. Avoid screen time and chores around the house. Increase water consumption to at least 40 oz a day. Use warm compress to hematoma on forehead.Apply heating pad to neck at least 4-5 x day for 20 minutes at time. Given educational material for neck stretches and encouraged to performed those daily for at least 15 minutes. Given Rx for stronger dose of tylenol to use prn for pain. Follow up in 3 days for reevaluation.  - acetaminophen (TYLENOL) 500 MG tablet; Take 1 tablet (500 mg total) by mouth every 6 (six) hours as needed.  Dispense: 30 tablet; Refill: 0  Benjiman CoreBrittany Wiseman, PA-C  [facility] 01/11/2017 11:05 AM

## 2017-01-09 NOTE — Patient Instructions (Addendum)
For your headache, try to brain rest completely for the next 2-3 days. Do not do any screen time or chores around the house. Increase your water consumption to at least 40 oz a day. Use a warm compress to the bruise on your forehead. Apply a heating pad to your neck at least 4-5 x a day for 20 minutes at a time to help with muscle spasms. I also recommend doing the stretches below daily for at least 15 minutes. I have given you a stronger prescription for tylenol, please take 1-2 tablets every 6 hours. Do not take this with the tylenol you have at home. Follow up on Monday in office for further evaluation.   Cervical Strain and Sprain Rehab Ask your health care provider which exercises are safe for you. Do exercises exactly as told by your health care provider and adjust them as directed. It is normal to feel mild stretching, pulling, tightness, or discomfort as you do these exercises, but you should stop right away if you feel sudden pain or your pain gets worse.Do not begin these exercises until told by your health care provider. Stretching and range of motion exercises These exercises warm up your muscles and joints and improve the movement and flexibility of your neck. These exercises also help to relieve pain, numbness, and tingling. Exercise A: Cervical side bend  1. Using good posture, sit on a stable chair or stand up. 2. Without moving your shoulders, slowly tilt your left / right ear to your shoulder until you feel a stretch in your neck muscles. You should be looking straight ahead. 3. Hold for __________ seconds. 4. Repeat with the other side of your neck. Repeat __________ times. Complete this exercise __________ times a day. Exercise B: Cervical rotation  1. Using good posture, sit on a stable chair or stand up. 2. Slowly turn your head to the side as if you are looking over your left / right shoulder. ? Keep your eyes level with the ground. ? Stop when you feel a stretch along the  side and the back of your neck. 3. Hold for __________ seconds. 4. Repeat this by turning to your other side. Repeat __________ times. Complete this exercise __________ times a day. Exercise C: Thoracic extension and pectoral stretch 1. Roll a towel or a small blanket so it is about 4 inches (10 cm) in diameter. 2. Lie down on your back on a firm surface. 3. Put the towel lengthwise, under your spine in the middle of your back. It should not be not under your shoulder blades. The towel should line up with your spine from your middle back to your lower back. 4. Put your hands behind your head and let your elbows fall out to your sides. 5. Hold for __________ seconds. Repeat __________ times. Complete this exercise __________ times a day. Strengthening exercises These exercises build strength and endurance in your neck. Endurance is the ability to use your muscles for a long time, even after your muscles get tired. Exercise D: Upper cervical flexion, isometric 1. Lie on your back with a thin pillow behind your head and a small rolled-up towel under your neck. 2. Gently tuck your chin toward your chest and nod your head down to look toward your feet. Do not lift your head off the pillow. 3. Hold for __________ seconds. 4. Release the tension slowly. Relax your neck muscles completely before you repeat this exercise. Repeat __________ times. Complete this exercise __________ times a day.  Exercise E: Cervical extension, isometric  1. Stand about 6 inches (15 cm) away from a wall, with your back facing the wall. 2. Place a soft object, about 6-8 inches (15-20 cm) in diameter, between the back of your head and the wall. A soft object could be a small pillow, a ball, or a folded towel. 3. Gently tilt your head back and press into the soft object. Keep your jaw and forehead relaxed. 4. Hold for __________ seconds. 5. Release the tension slowly. Relax your neck muscles completely before you repeat  this exercise. Repeat __________ times. Complete this exercise __________ times a day. Posture and body mechanics  Body mechanics refers to the movements and positions of your body while you do your daily activities. Posture is part of body mechanics. Good posture and healthy body mechanics can help to relieve stress in your body's tissues and joints. Good posture means that your spine is in its natural S-curve position (your spine is neutral), your shoulders are pulled back slightly, and your head is not tipped forward. The following are general guidelines for applying improved posture and body mechanics to your everyday activities. Standing  When standing, keep your spine neutral and keep your feet about hip-width apart. Keep a slight bend in your knees. Your ears, shoulders, and hips should line up.  When you do a task in which you stand in one place for a long time, place one foot up on a stable object that is 2-4 inches (5-10 cm) high, such as a footstool. This helps keep your spine neutral. Sitting   When sitting, keep your spine neutral and your keep feet flat on the floor. Use a footrest, if necessary, and keep your thighs parallel to the floor. Avoid rounding your shoulders, and avoid tilting your head forward.  When working at a desk or a computer, keep your desk at a height where your hands are slightly lower than your elbows. Slide your chair under your desk so you are close enough to maintain good posture.  When working at a computer, place your monitor at a height where you are looking straight ahead and you do not have to tilt your head forward or downward to look at the screen. Resting When lying down and resting, avoid positions that are most painful for you. Try to support your neck in a neutral position. You can use a contour pillow or a small rolled-up towel. Your pillow should support your neck but not push on it. This information is not intended to replace advice given to  you by your health care provider. Make sure you discuss any questions you have with your health care provider. Document Released: 06/30/2005 Document Revised: 03/06/2016 Document Reviewed: 06/06/2015 Elsevier Interactive Patient Education  2018 [employer]    Muscle Cramps and Spasms Muscle cramps and spasms are when muscles tighten by themselves. They usually get better within minutes. Muscle cramps are painful. They are usually stronger and last longer than muscle spasms. Muscle spasms may or may not be painful. They can last a few seconds or much longer. Follow these instructions at home:  Drink enough fluid to keep your pee (urine) clear or pale yellow.  Massage, stretch, and relax the muscle.  If directed, apply heat to tight or tense muscles as often as told by your doctor. Use the heat source that your doctor recommends. ? Place a towel between your skin and the heat source. ? Leave the heat on for 20-30 minutes. ? Take  off the heat if your skin turns bright red. This is especially important if you are unable to feel pain, heat, or cold. You may have a greater risk of getting burned.  If directed, put ice on the affected area. This may help if you are sore or have pain after a cramp or spasm. ? Put ice in a plastic bag. ? Place a towel between your skin and the bag. ? Leave the ice on for 20 minutes, 2-3 times a day.  Take over-the-counter and prescription medicines only as told by your doctor.  Pay attention to any changes in your symptoms. Contact a doctor if:  Your cramps or spasms get worse or happen more often.  Your cramps or spasms do not get better with time. This information is not intended to replace advice given to you by your health care provider. Make sure you discuss any questions you have with your health care provider. Document Released: 06/12/2008 Document Revised: 08/01/2015 Document Reviewed: 04/03/2015 Elsevier Interactive Patient Education  2018  [employer]    Post-Concussion Syndrome Post-concussion syndrome is the symptoms that can occur after a head injury. These symptoms can last from weeks to months. Follow these instructions at home:  Take medicines only as told by your doctor.  Do not take aspirin.  Sleep with your head raised to help with headaches.  Avoid activities that can cause another head injury. ? Do not play contact sports like football, hockey, soccer, or basketball. ? Do not do other risky activities like downhill skiing, martial arts, or horseback riding until your doctor says it is okay.  Keep all follow-up visits as told by your doctor. This is important. Contact a doctor if:  You have a harder time: ? Paying attention. ? Focusing. ? Remembering. ? Learning new information. ? Dealing with stress.  You need more time to complete tasks.  You are easily bothered (irritable).  You have more symptoms. Get help if you have any of these symptoms for more than two weeks after your injury:  Long-lasting (chronic) headaches.  Dizziness.  Trouble balancing.  Feeling sick to your stomach (nauseous).  Trouble with your vision.  Noise or light bothers you more.  Depression.  Mood swings.  Feeling worried (anxious).  Easily bothered.  Memory problems.  Trouble concentrating or paying attention.  Sleep problems.  Feeling tired all of the time.  Get help right away if:  You feel confused.  You feel very sleepy.  You are hard to wake up.  You feel sick to your stomach.  You keep throwing up (vomiting).  You feel like you are moving when you are not (vertigo).  Your eyes move back and forth very quickly.  You start shaking (convulsing) or pass out (faint).  You have very bad headaches that do not get better with medicine.  You cannot use your arms or legs like normal.  One of the black centers of your eyes (pupils) is bigger than the other.  You have clear or bloody  fluid coming from your nose or ears.  Your problems get worse, not better. This information is not intended to replace advice given to you by your health care provider. Make sure you discuss any questions you have with your health care provider. Document Released: 08/07/2004 Document Revised: 12/06/2015 Document Reviewed: 10/05/2013 Elsevier Interactive Patient Education  2018 [employer]   IF you received an x-ray today, you will receive an invoice from [facility]. Please contact [facility] at  [phone] with questions or concerns regarding your invoice.   IF you received labwork today, you will receive an invoice from [facility]. Please contact [facility] at [phone] with questions or concerns regarding your invoice.   Our billing staff will not be able to assist you with questions regarding bills from these companies.  You will be contacted with the lab results as soon as they are available. The fastest way to get your results is to activate your My Chart account. Instructions are located on the last page of this paperwork. If you have not heard from [place] regarding the results in 2 weeks, please contact this office.

## 2017-01-11 ENCOUNTER — Encounter: Payer: Self-pay | Admitting: Physician Assistant

## 2017-01-12 ENCOUNTER — Ambulatory Visit (INDEPENDENT_AMBULATORY_CARE_PROVIDER_SITE_OTHER): Payer: Medicare Other | Admitting: Physician Assistant

## 2017-01-12 ENCOUNTER — Encounter: Payer: Self-pay | Admitting: Physician Assistant

## 2017-01-12 VITALS — BP 117/71 | HR 63 | Temp 97.8°F | Resp 18 | Ht <= 58 in | Wt 109.4 lb

## 2017-01-12 DIAGNOSIS — M62838 Other muscle spasm: Secondary | ICD-10-CM

## 2017-01-12 DIAGNOSIS — R51 Headache: Secondary | ICD-10-CM

## 2017-01-12 DIAGNOSIS — F0781 Postconcussional syndrome: Secondary | ICD-10-CM

## 2017-01-12 DIAGNOSIS — R519 Headache, unspecified: Secondary | ICD-10-CM

## 2017-01-12 NOTE — Patient Instructions (Addendum)
Cervical Strain and Sprain Rehab Ask your health care provider which exercises are safe for you. Do exercises exactly as told by your health care provider and adjust them as directed. It is normal to feel mild stretching, pulling, tightness, or discomfort as you do these exercises, but you should stop right away if you feel sudden pain or your pain gets worse.Do not begin these exercises until told by your health care provider. Stretching and range of motion exercises These exercises warm up your muscles and joints and improve the movement and flexibility of your neck. These exercises also help to relieve pain, numbness, and tingling. Exercise A: Cervical side bend  1. Using good posture, sit on a stable chair or stand up. 2. Without moving your shoulders, slowly tilt your left / right ear to your shoulder until you feel a stretch in your neck muscles. You should be looking straight ahead. 3. Hold for __________ seconds. 4. Repeat with the other side of your neck. Repeat __________ times. Complete this exercise __________ times a day. Exercise B: Cervical rotation  1. Using good posture, sit on a stable chair or stand up. 2. Slowly turn your head to the side as if you are looking over your left / right shoulder. ? Keep your eyes level with the ground. ? Stop when you feel a stretch along the side and the back of your neck. 3. Hold for __________ seconds. 4. Repeat this by turning to your other side. Repeat __________ times. Complete this exercise __________ times a day. Exercise C: Thoracic extension and pectoral stretch 1. Roll a towel or a small blanket so it is about 4 inches (10 cm) in diameter. 2. Lie down on your back on a firm surface. 3. Put the towel lengthwise, under your spine in the middle of your back. It should not be not under your shoulder blades. The towel should line up with your spine from your middle back to your lower back. 4. Put your hands behind your head and let your  elbows fall out to your sides. 5. Hold for __________ seconds. Repeat __________ times. Complete this exercise __________ times a day. Strengthening exercises These exercises build strength and endurance in your neck. Endurance is the ability to use your muscles for a long time, even after your muscles get tired. Exercise D: Upper cervical flexion, isometric 1. Lie on your back with a thin pillow behind your head and a small rolled-up towel under your neck. 2. Gently tuck your chin toward your chest and nod your head down to look toward your feet. Do not lift your head off the pillow. 3. Hold for __________ seconds. 4. Release the tension slowly. Relax your neck muscles completely before you repeat this exercise. Repeat __________ times. Complete this exercise __________ times a day. Exercise E: Cervical extension, isometric  1. Stand about 6 inches (15 cm) away from a wall, with your back facing the wall. 2. Place a soft object, about 6-8 inches (15-20 cm) in diameter, between the back of your head and the wall. A soft object could be a small pillow, a ball, or a folded towel. 3. Gently tilt your head back and press into the soft object. Keep your jaw and forehead relaxed. 4. Hold for __________ seconds. 5. Release the tension slowly. Relax your neck muscles completely before you repeat this exercise. Repeat __________ times. Complete this exercise __________ times a day. Posture and body mechanics  Body mechanics refers to the movements and positions of   your body while you do your daily activities. Posture is part of body mechanics. Good posture and healthy body mechanics can help to relieve stress in your body's tissues and joints. Good posture means that your spine is in its natural S-curve position (your spine is neutral), your shoulders are pulled back slightly, and your head is not tipped forward. The following are general guidelines for applying improved posture and body mechanics to  your everyday activities. Standing  When standing, keep your spine neutral and keep your feet about hip-width apart. Keep a slight bend in your knees. Your ears, shoulders, and hips should line up.  When you do a task in which you stand in one place for a long time, place one foot up on a stable object that is 2-4 inches (5-10 cm) high, such as a footstool. This helps keep your spine neutral. Sitting   When sitting, keep your spine neutral and your keep feet flat on the floor. Use a footrest, if necessary, and keep your thighs parallel to the floor. Avoid rounding your shoulders, and avoid tilting your head forward.  When working at a desk or a computer, keep your desk at a height where your hands are slightly lower than your elbows. Slide your chair under your desk so you are close enough to maintain good posture.  When working at a computer, place your monitor at a height where you are looking straight ahead and you do not have to tilt your head forward or downward to look at the screen. Resting When lying down and resting, avoid positions that are most painful for you. Try to support your neck in a neutral position. You can use a contour pillow or a small rolled-up towel. Your pillow should support your neck but not push on it. This information is not intended to replace advice given to you by your health care provider. Make sure you discuss any questions you have with your health care provider. Document Released: 06/30/2005 Document Revised: 03/06/2016 Document Reviewed: 06/06/2015 Elsevier Interactive Patient Education  2018 Elsevier Inc.  

## 2017-01-12 NOTE — Progress Notes (Signed)
   Olivia Hartman  MRN: [id number] DOB: 03/13/1934  PCP: Patient, No Pcp Per  Chief Complaint  Patient presents with  . Follow-up    cervical strain     Subjective:  Pt presents to clinic for recheck.  She is doing much better - she still has headaches but they are improved -she is still using tylenol.  The pain in her neck and upper back is bette - mainly stiffr.  She is having no dizziness or memory loss or confusion.  No vision changes.  Language barrier - interpretor in room  Review of Systems  Eyes: Negative for visual disturbance.  Musculoskeletal: Positive for back pain (upper) and neck pain.  Neurological: Positive for headaches. Negative for dizziness and light-headedness.    Patient Active Problem List   Diagnosis Date Noted  . Healthcare maintenance 04/02/2012  . Tendonitis of shoulder, right 04/02/2012    Current Outpatient Prescriptions on File Prior to Visit  Medication Sig Dispense Refill  . acetaminophen (TYLENOL) 500 MG tablet Take 1 tablet (500 mg total) by mouth every 6 (six) hours as needed. 30 tablet 0   No current facility-administered medications on file prior to visit.     No Known Allergies  Pt patients past, family and social history were reviewed and updated.   Objective:  BP 117/71   Pulse 63   Temp 97.8 F (36.6 C) (Oral)   Resp 18   Ht 4' 8.5" (1.435 m)   Wt 109 lb 6.4 oz (49.6 kg)   SpO2 100%   BMI 24.09 kg/m   Physical Exam  Constitutional: She is oriented to person, place, and time and well-developed, well-nourished, and in no distress.  HENT:  Head: Normocephalic and atraumatic.  Right Ear: Hearing and external ear normal.  Left Ear: Hearing and external ear normal.  Eyes: Conjunctivae are normal.  Neck: Normal range of motion.  Pulmonary/Chest: Effort normal.  Musculoskeletal:       Cervical back: She exhibits spasm (trapezius). She exhibits normal range of motion and no tenderness.       Thoracic back: She exhibits spasm.  She exhibits normal range of motion and no tenderness.       Back:  Poor upper back posture  Neurological: She is alert and oriented to person, place, and time. She has normal motor skills, normal sensation, normal strength and normal reflexes. She displays normal reflexes. Gait normal. Gait normal.  Skin: Skin is warm and dry.  Psychiatric: Mood, memory, affect and judgment normal.  Vitals reviewed.   Assessment and Plan :  Neck muscle spasm - Plan: Ambulatory referral to Physical Therapy  Nonintractable headache, unspecified chronicity pattern, unspecified headache type  Post concussion syndrome   Pt has improved - she will start to use tylenol prn - pt has continued muscle spasm and would benefit from PT - she is improved from a post-concussive standpoint though not resolved.  I question the role of muscle spasms in her headaches and PT should help with that.  I do not want to do muscle relaxers for her due to age.  F/u after PT or sooner if symptoms worsen.  Benny LennertSarah Weber PA-C  [facility] at [facility] 01/12/2017 4:59 PM

## 2017-02-17 ENCOUNTER — Ambulatory Visit: Payer: Medicare Other | Admitting: Physical Therapy

## 2017-03-09 ENCOUNTER — Ambulatory Visit: Payer: Medicare Other | Admitting: Physical Therapy

## 2017-03-31 ENCOUNTER — Ambulatory Visit: Payer: Medicare Other | Attending: Physician Assistant | Admitting: Physical Therapy

## 2017-03-31 DIAGNOSIS — M542 Cervicalgia: Secondary | ICD-10-CM | POA: Insufficient documentation

## 2017-03-31 DIAGNOSIS — R252 Cramp and spasm: Secondary | ICD-10-CM

## 2017-03-31 NOTE — Therapy (Addendum)
[facility] [address] [place], [place], [address] Phone: [phone]   Fax:  [phone]  Physical Therapy Evaluation  Patient Details  Name: Olivia Hartman MRN: [id number] Date of Birth: 08/02/1933 Referring Provider: Dr. Windell Hummingbird  Encounter Date: 03/31/2017      PT End of Session - 03/31/17 1613    Visit Number 1   Date for PT Re-Evaluation 05/26/17   Authorization Type medicare/medicaid; g-code 10th visit   PT Start Time 1615   PT Stop Time 1650   PT Time Calculation (min) 35 min   Activity Tolerance Patient tolerated treatment well   Behavior During Therapy [facility] for tasks assessed/performed      Past Medical History:  Diagnosis Date  . Cataract     No past surgical history on file.  There were no vitals filed for this visit.       Subjective Assessment - 03/31/17 1631    Subjective I do have pain. Started 3 months. Door to refrigerator was open and looked up hitting the back of her head causing pain.    Patient is accompained by: Interpreter   Patient Stated Goals reduce pain   Currently in Pain? Yes   Pain Score 6    Pain Location Neck   Pain Orientation Right;Left   Pain Descriptors / Indicators Sharp   Pain Type Acute pain   Pain Onset More than a month ago   Pain Frequency Intermittent   Aggravating Factors  looking upward; turning head   Pain Relieving Factors heat             [facility] PT Assessment - 03/31/17 0001      Assessment   Medical Diagnosis M62.838 Neck muscle spasm   Referring Provider Dr. Windell Hummingbird   Onset Date/Surgical Date 01/28/17   Prior Therapy none     Precautions   Precautions None     Restrictions   Weight Bearing Restrictions No     Balance Screen   Has the patient fallen in the past 6 months No   Has the patient had a decrease in activity level because of a fear of falling?  No   Is the patient reluctant to leave their home because of a fear of  falling?  No     Home Ecologist residence     Prior Function   Level of Independence Independent   Vocation Retired     Associate Professor   Overall Cognitive Status Within Functional Limits for tasks assessed     Observation/Other Assessments   Focus on Therapeutic Outcomes (FOTO)  Therapist discretion is 40% limitation  goal is 20% limitation     Posture/Postural Control   Posture/Postural Control Postural limitations   Postural Limitations Rounded Shoulders;Forward head;Increased thoracic kyphosis     ROM / Strength   AROM / PROM / Strength AROM;Strength     AROM   Cervical Flexion full   Cervical Extension decreased by 25%   Cervical - Right Side Bend full   Cervical - Left Side Bend full   Cervical - Right Rotation decreased by 25%   Cervical - Left Rotation decreased by 25%   Thoracic - Right Rotation decreased by 25%   Thoracic - Left Rotation decreased by 25%     Strength   Overall Strength Comments bilateral shoulder strength is 4/5     Palpation   Palpation comment tendernes located in cervical and thoracic paraspinals  Transfers   Transfers Not assessed     Ambulation/Gait   Ambulation/Gait No            Objective measurements completed on examination: See above findings.                    PT Short Term Goals - 03/31/17 1615      PT SHORT TERM GOAL #1   Title pain with daily activities decreased >/= 50%   Time 4   Period Weeks   Status New   Target Date 04/28/17     PT SHORT TERM GOAL #2   Title independent with initial HEP   Time 4   Period Weeks   Status New   Target Date 04/28/17     PT SHORT TERM GOAL #3   Title improved cervical ROM to perform daily activities with >/= 25% greater ease   Time 4   Period Weeks   Status New   Target Date 04/28/17           PT Long Term Goals - 03/31/17 1614      PT LONG TERM GOAL #1   Title independent with  HEP   Time 8   Period Weeks    Status New   Target Date 05/26/17     PT LONG TERM GOAL #2   Title pain with daily activities decreased >/= 50%   Time 8   Period Weeks   Status New   Target Date 05/26/17     PT LONG TERM GOAL #3   Title full cervical ROM to perform daily activities with >/= 75% greater ease   Time 8   Period Weeks   Status New   Target Date 05/26/17                Plan - 03/31/17 1623    Clinical Impression Statement Patient reports she hit her head on an open door of refrigerator when looking up 3 months ago causing pain from her head to her midback.  Patient reports pain level is 6/10 with movement.  Patient cervical ROM is limited by 25% and thoracic rotation decreased by 25%.  [place] consists of forward head, rounded shoulders and increaesd thoracic kyphosis.  Palpable tenderness located on cervical and thoracic paraspinals. Bilateral shoulder strength is 4/5.  Patient friend was present to assist in interpretation due to the language line was disconnected 2 times. Patient will benefit from skilled therapy to reduce pain and improve funciton.      History and Personal Factors relevant to plan of care: none; only speaks [place]/arabic   Clinical Presentation Stable   Clinical Presentation due to: stable condition   Clinical Decision Making Low   Rehab Potential Good   Clinical Impairments Affecting Rehab Potential speaks [place]; needs interpreter or language line [phone] Access code [phone]   PT Frequency 2x / week   PT Duration 8 weeks   PT Treatment/Interventions Moist Heat;Traction;Ultrasound;Therapeutic activities;Therapeutic exercise;Neuromuscular re-education;Patient/family education;Cryotherapy;Electrical Stimulation;Manual techniques;Passive range of motion   PT Next Visit Plan modalities as needed; soft tissue work; cervical ROM exercises; body mechanics   PT Home Exercise Plan progress as needed   Consulted and Agree with Plan of Care Patient      Patient will  benefit from skilled therapeutic intervention in order to improve the following deficits and impairments:  Pain, Increased muscle spasms, Decreased mobility  Visit Diagnosis: Cervicalgia - Plan: PT plan of care cert/re-cert  Cramp and spasm - Plan:  PT plan of care cert/re-cert      G-Codes - 01/00/71 1657    Functional Assessment Tool Used (Outpatient Only) therapist discretion with limited cervical ROM and pain level is 40% limited  goal is 20% limited   Functional Limitation Other PT primary   Other PT Primary Current Status (Q1975) At least 40 percent but less than 60 percent impaired, limited or restricted   Other PT Primary Goal Status (O8325) At least 20 percent but less than 40 percent impaired, limited or restricted       Problem List Patient Active Problem List   Diagnosis Date Noted  . Healthcare maintenance 04/02/2012  . Tendonitis of shoulder, right 04/02/2012    Earlie Counts, PT 03/31/17 5:04 PM   [facility] [address], [place], [place], [address] Phone: [phone]   Fax:  [phone]  Name: Devri Kreher MRN: [id number] Date of Birth: [birth year] PHYSICAL THERAPY DISCHARGE SUMMARY  Visits from Start of Care: 1  Current functional level related to goals / functional outcomes: See above.    Remaining deficits: See above.  Unable to fully assess patient for discharge due to her not returning since initial evaluation.    Education / Equipment: HEP  Plan:                                                    Patient goals were not met. Patient is being discharged due to not returning since the last visit.  Thank you for the referral. Earlie Counts, PT 08/06/17 3:25 PM  ???

## 2017-05-24 DIAGNOSIS — R079 Chest pain, unspecified: Secondary | ICD-10-CM | POA: Diagnosis not present

## 2017-05-24 DIAGNOSIS — R42 Dizziness and giddiness: Secondary | ICD-10-CM | POA: Diagnosis not present

## 2017-07-29 DIAGNOSIS — R509 Fever, unspecified: Secondary | ICD-10-CM | POA: Diagnosis not present

## 2017-11-10 IMAGING — CT CT HEAD W/O CM
5 of 8 series · 17 of 47 positions shown, 18 images · non-contrast
Comparison: Head CT dated 05/10/2014 and head and cervical spine CT
dated 03/05/2012.

CLINICAL DATA: Headache and neck pain after hitting the head on the
top of a refrigerator yesterday.

EXAM:
CT HEAD WITHOUT CONTRAST
CT CERVICAL SPINE WITHOUT CONTRAST
TECHNIQUE: Multidetector CT imaging of the head and cervical spine was
performed following the standard protocol without intravenous
contrast. Multiplanar CT image reconstructions of the cervical spine
were also generated.

[Series 4: head without · axial · non-contrast · 0.41mm/px · z∈[-82,-27]mm · 2 of 33 slices shown, 3 images]
[im 11/33  brain]
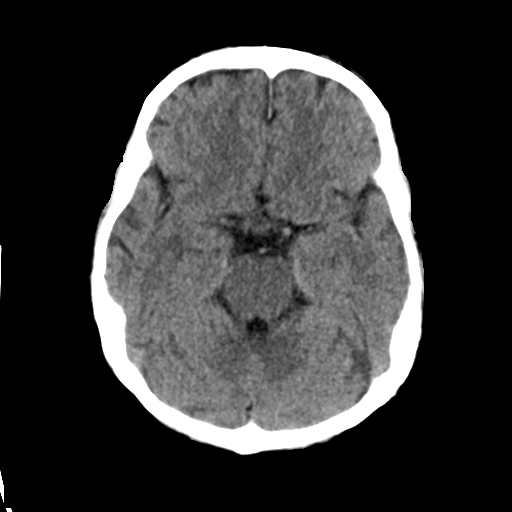
[im 11/33  bone]
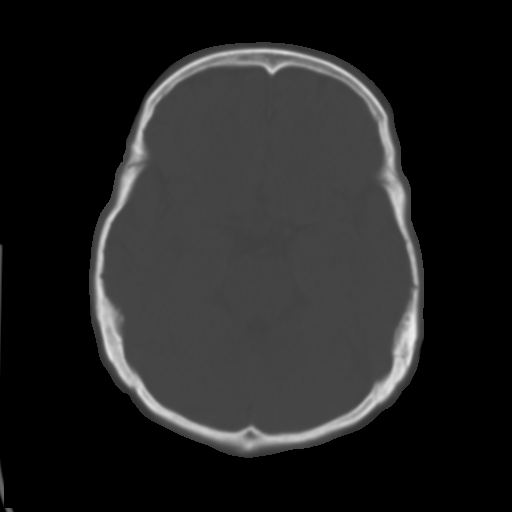
[im 22/33  brain]
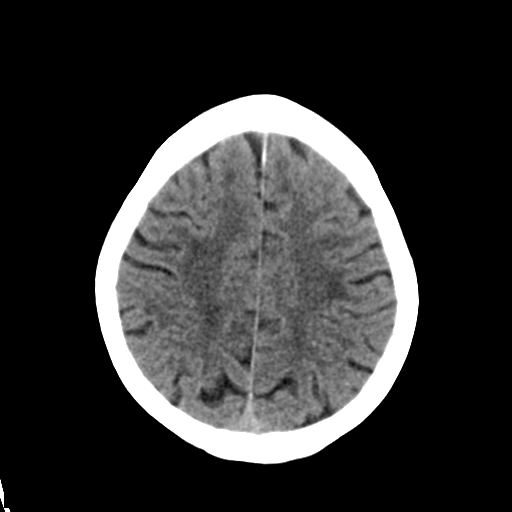

[Series 5: head bone · axial · 0.41mm/px · z∈[-112,+8]mm · 7 of 81 slices shown]
[im 11/81  bone]
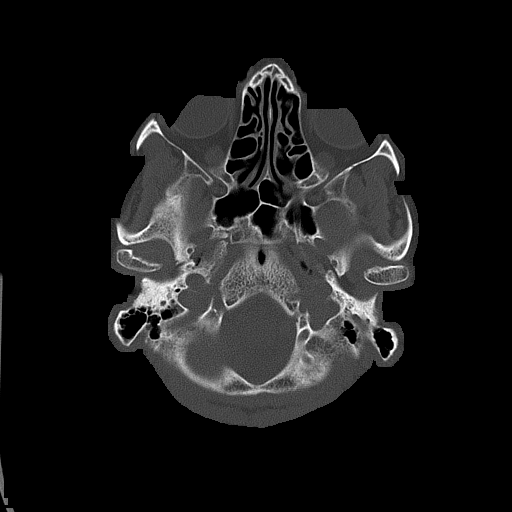
[im 21/81  bone]
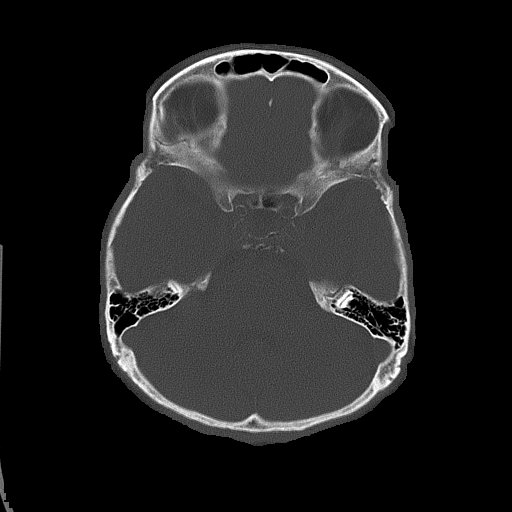
[im 31/81  bone]
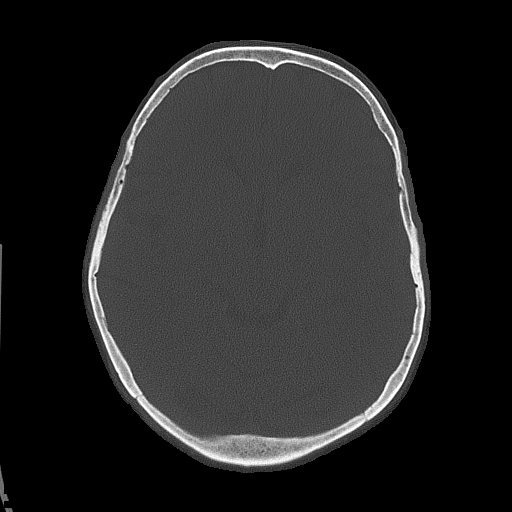
[im 41/81  bone]
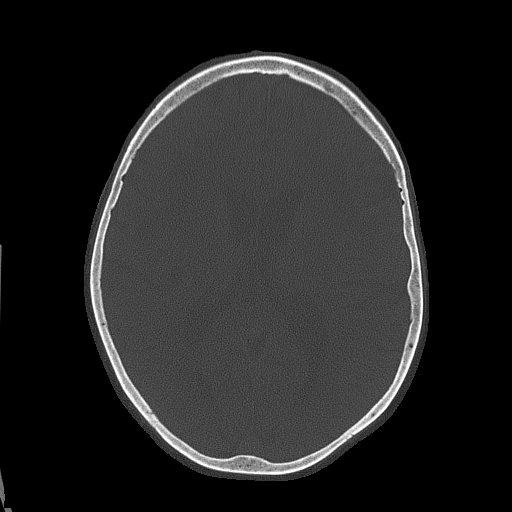
[im 51/81  bone]
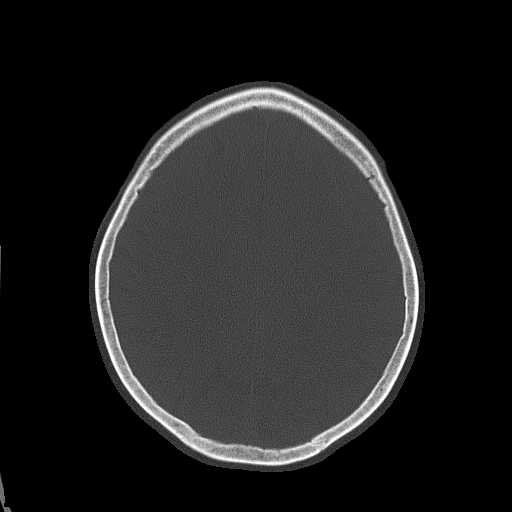
[im 61/81  bone]
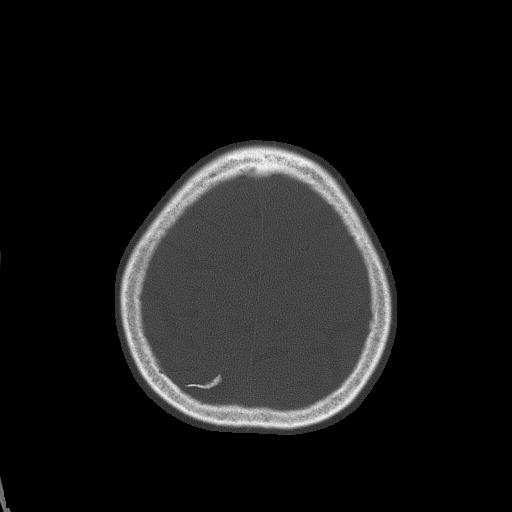
[im 71/81  bone]
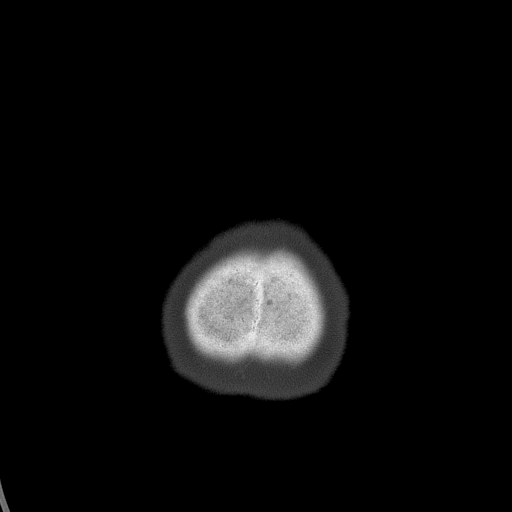

[Series 6: head without cor · coronal · non-contrast · 0.30mm/px · 3 of 64 slices shown]
[im 19/64  brain]
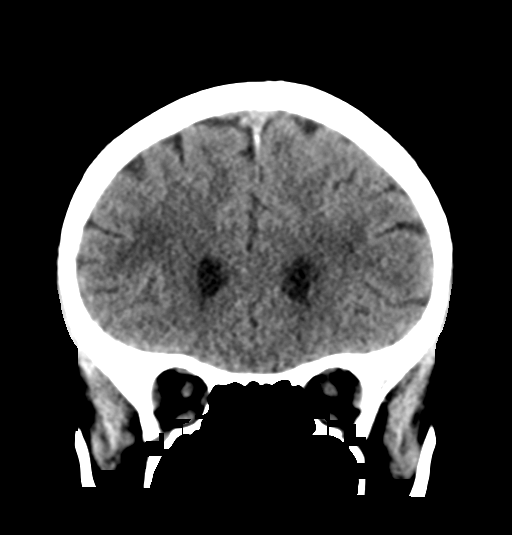
[im 28/64  brain]
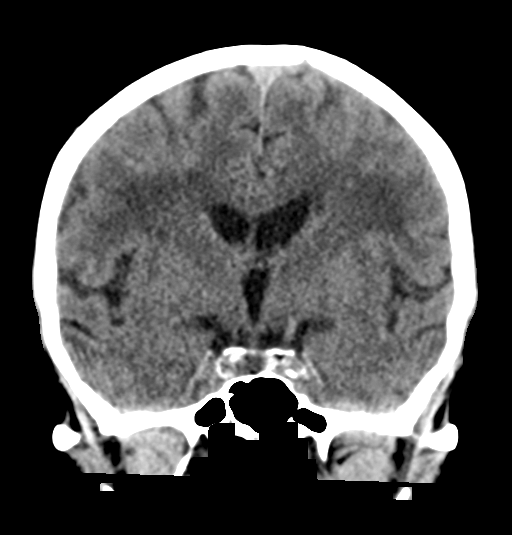
[im 37/64  brain]
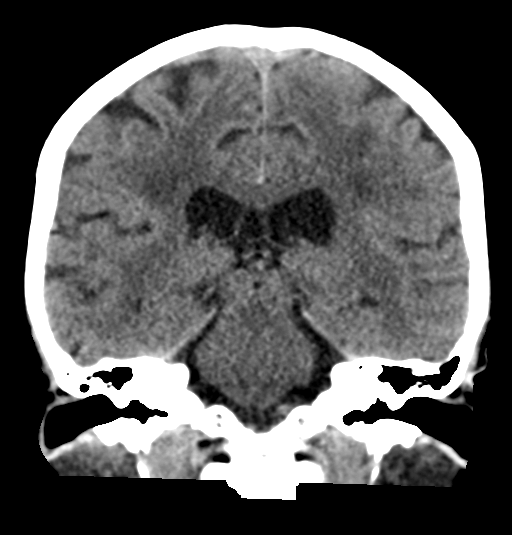

[Series 7: head without sag · sagittal · non-contrast · 0.28mm/px · 1 of 53 slices shown]
[im 27/53  brain]
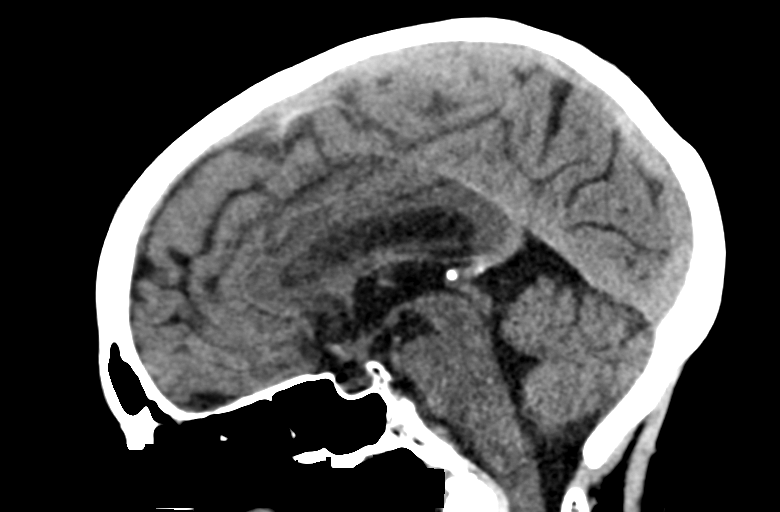

[Series 12: c_spine 2.0 orthogonals · axial · 0.21mm/px · z∈[-243,-190]mm · 4 of 76 slices shown]
[im 10/76  brain]
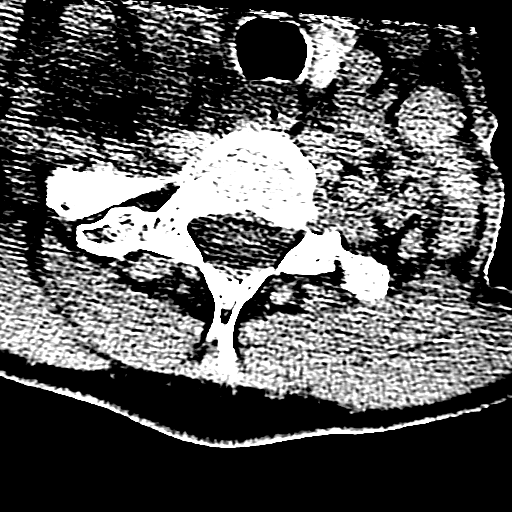
[im 19/76  brain]
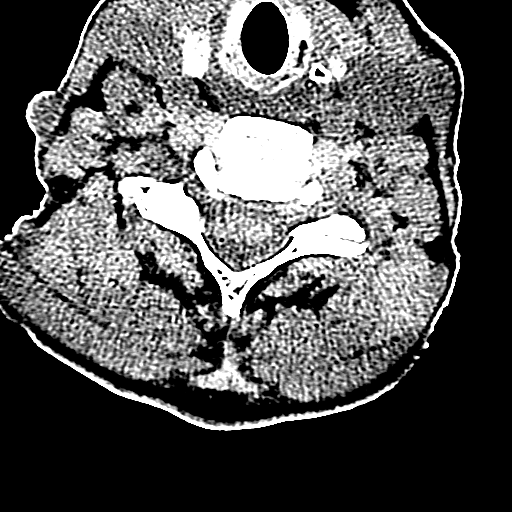
[im 29/76  brain]
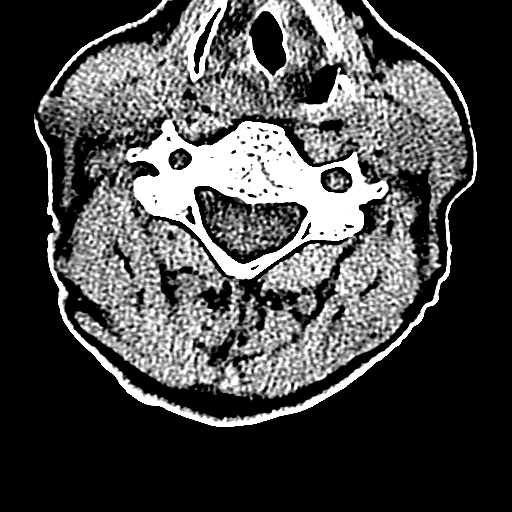
[im 38/76  brain]
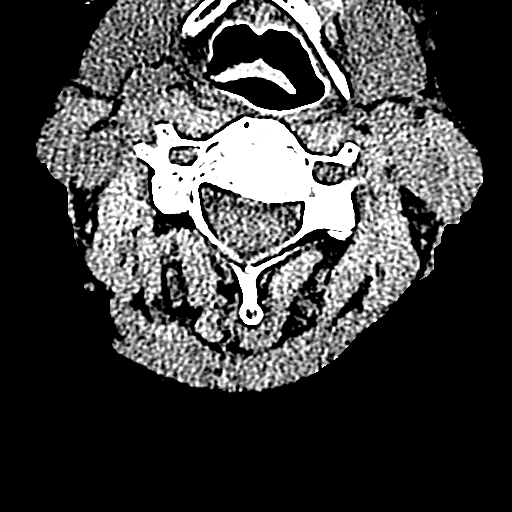

[17 of 47 positions shown; findings below may reference images not displayed]

FINDINGS: CT HEAD FINDINGS

Brain: Diffusely enlarged ventricles and subarachnoid spaces. Patchy
white matter low density in both cerebral hemispheres. Stable right
posterior parietal curvilinear calcific density within a sulcus. No
intracranial hemorrhage, mass lesion or CT evidence of acute
infarction.

Vascular: No hyperdense vessel or unexpected calcification.

Skull: Normal. Negative for fracture or focal lesion.

Sinuses/Orbits: Status post bilateral cataract extraction.
Unremarkable included paranasal sinuses.

Other: None.

CT CERVICAL SPINE FINDINGS

Alignment: Normal.

Skull base and vertebrae: No acute fracture. No primary bone lesion
or focal pathologic process.

Soft tissues and spinal canal: No prevertebral fluid or swelling. No
visible canal hematoma.

Disc levels:  Unremarkable.

Upper chest: Clear lung apices.

Other: Mildly heterogeneous thyroid gland.
IMPRESSION: 1. No skull fracture or intracranial hemorrhage.
2. No cervical spine fracture or subluxation.
3. Stable mild cerebral and cerebellar atrophy atrophy, chronic
small vessel white matter ischemic changes and right parietal sulcus
calcification.

## 2018-03-04 DIAGNOSIS — R252 Cramp and spasm: Secondary | ICD-10-CM | POA: Diagnosis not present

## 2018-03-09 DIAGNOSIS — R252 Cramp and spasm: Secondary | ICD-10-CM | POA: Diagnosis not present

## 2018-03-09 DIAGNOSIS — D72819 Decreased white blood cell count, unspecified: Secondary | ICD-10-CM | POA: Diagnosis not present

## 2021-02-28 ENCOUNTER — Other Ambulatory Visit: Payer: Self-pay | Admitting: Physician Assistant

## 2021-02-28 DIAGNOSIS — M79605 Pain in left leg: Secondary | ICD-10-CM

## 2021-03-15 ENCOUNTER — Other Ambulatory Visit: Payer: Medicare Other

## 2021-03-26 ENCOUNTER — Other Ambulatory Visit: Payer: Medicare Other

## 2021-05-20 ENCOUNTER — Ambulatory Visit
Admission: RE | Admit: 2021-05-20 | Discharge: 2021-05-20 | Disposition: A | Discharge status: Home / Self Care | Payer: Medicare Other | Source: Ambulatory Visit | Attending: Physician Assistant | Admitting: Physician Assistant

## 2021-05-20 DIAGNOSIS — M79605 Pain in left leg: Secondary | ICD-10-CM

## 2023-05-02 ENCOUNTER — Other Ambulatory Visit: Payer: Self-pay

## 2023-05-02 ENCOUNTER — Emergency Department (HOSPITAL_COMMUNITY): Payer: Medicare HMO

## 2023-05-02 ENCOUNTER — Emergency Department (HOSPITAL_COMMUNITY)
Admission: EM | Admit: 2023-05-02 | Discharge: 2023-05-02 | Disposition: A | Discharge status: Home / Self Care | Payer: Medicare HMO | Attending: Emergency Medicine | Admitting: Emergency Medicine

## 2023-05-02 DIAGNOSIS — M25561 Pain in right knee: Secondary | ICD-10-CM | POA: Insufficient documentation

## 2023-05-02 MED ORDER — OXYCODONE-ACETAMINOPHEN 5-325 MG PO TABS
1.0000 | ORAL_TABLET | Freq: Once | ORAL | Status: AC
  Administered 2023-05-02: 1 via ORAL
  Filled 2023-05-02: qty 1

## 2023-05-02 MED ORDER — OXYCODONE-ACETAMINOPHEN 5-325 MG PO TABS
1.0000 | ORAL_TABLET | Freq: Four times a day (QID) | ORAL | 0 refills | Status: AC | PRN
Start: 2023-05-02 — End: ?

## 2023-05-02 MED ORDER — ACETAMINOPHEN 325 MG PO TABS
650.0000 mg | ORAL_TABLET | Freq: Four times a day (QID) | ORAL | 0 refills | Status: AC | PRN
Start: 2023-05-02 — End: ?

## 2023-05-02 NOTE — ED Provider Notes (Signed)
[facility] AT [facility] Provider Note   CSN: [id number] Arrival date & time: 05/02/23  0303     History  Chief Complaint  Patient presents with   Right Knee Pain     Olivia Hartman is a 89 y.o. female no significant past medical history presents today for evaluation of knee pain.  Patient has chronic right knee pain that has gotten worse in the last 2 days.  States the pain is mostly in the back of her knee.  She denies any calf swelling or pain. No recent long travel or recent surgery, no personal or family history of DVT.  She is able to bear weight and ambulate.  She was seen by an orthopedics in January last year for a knee injection.  She denies any recent fall or direct trauma to her knee.  Denies any fever.  HPI  Past Medical History:  Diagnosis Date   Cataract    No past surgical history on file.   Home Medications Prior to Admission medications   Medication Sig Start Date End Date Taking? Authorizing Provider  acetaminophen (TYLENOL) 500 MG tablet Take 1 tablet (500 mg total) by mouth every 6 (six) hours as needed. 01/09/17   Benjiman Core D, PA-C      Allergies    Patient has no known allergies.    Review of Systems   Review of Systems Negative except as per HPI.  Physical Exam Updated Vital Signs BP (!) 154/86 (BP Location: Left Arm)   Pulse (!) 50   Temp 97.7 F (36.5 C)   Resp 18   SpO2 100%  Physical Exam Vitals and nursing note reviewed.  Constitutional:      Appearance: Normal appearance.  HENT:     Head: Normocephalic and atraumatic.     Mouth/Throat:     Mouth: Mucous membranes are moist.  Eyes:     General: No scleral icterus. Cardiovascular:     Rate and Rhythm: Normal rate and regular rhythm.     Pulses: Normal pulses.     Heart sounds: Normal heart sounds.  Pulmonary:     Effort: Pulmonary effort is normal.     Breath sounds: Normal breath sounds.  Abdominal:     General: Abdomen is flat.      Palpations: Abdomen is soft.     Tenderness: There is no abdominal tenderness.  Musculoskeletal:        General: No deformity.     Comments: Tenderness to palpation to back of right knee.  Skin is cool to touch.  No skin redness.  Right calf is nontender.  Skin:    General: Skin is warm.     Findings: No rash.  Neurological:     General: No focal deficit present.     Mental Status: She is alert.  Psychiatric:        Mood and Affect: Mood normal.     ED Results / Procedures / Treatments   Labs (all labs ordered are listed, but only abnormal results are displayed) Labs Reviewed - No data to display  EKG None  Radiology DG Knee Complete 4 Views Right  Result Date: 05/02/2023 CLINICAL DATA:  Right knee swelling and pain. EXAM: RIGHT KNEE - COMPLETE 4+ VIEW COMPARISON:  Right femur series 02/04/2010, and report of right knee series from 07/30/2021 which is unavailable in PACS. FINDINGS: Generalized osteopenia.  No fracture is evident. There is femorotibial degenerative arthrosis consisting of moderate medial and mild  lateral compartment joint space loss and bulky marginal osteophytes greater medially. Lateral view also demonstrates narrowing and bulky inferior osteophytes at the patellofemoral joint and a small suprapatellar bursal effusion. There is no evidence of loose bodies, fractures or dislocation. Superficial soft tissues are unremarkable. IMPRESSION: Osteopenia and degenerative change with small suprapatellar bursal effusion. No fractures or loose bodies are evident. Electronically Signed   By: Almira Bar M.D.   On: 05/02/2023 04:00    Procedures Procedures    Medications Ordered in ED Medications  oxyCODONE-acetaminophen (PERCOCET/ROXICET) 5-325 MG per tablet 1 tablet (has no administration in time range)    ED Course/ Medical Decision Making/ A&P                                 Medical Decision Making Amount and/or Complexity of Data Reviewed Radiology:  ordered.  Risk Prescription drug management.   This patient presents to the ED for knee pain, this involves an extensive number of treatment options, and is a complaint that carries with a high risk of complications and morbidity.  The differential diagnosis includes fracture, dislocation, arthritis, bursitis, DVT.  This is not an exhaustive list.  Imaging studies: I ordered imaging studies, personally reviewed, interpreted imaging and agree with the radiologist's interpretations. The results include: X-ray of the right knee show suprapatellar bursal effusion.  No fracture or dislocation.  Problem list/ ED course/ Critical interventions/ Medical management: HPI: See above Vital signs within normal range and stable throughout visit. Laboratory/imaging studies significant for: See above. On physical examination, patient is afebrile and appears in no acute distress. Patient presents with right knee joint pain. Given history, exam and workup patient likely has arthritis vs bursitis. I have low suspicion for fracture, dislocation, significant ligamentous injury, septic arthritis, gout flare, new autoimmune arthropathy, or gonococcal arthropathy, DVT. She got a steroid injection in January last year. Advised patient to take Tylenol/ibuprofen/naproxen for pain, follow-up with primary care physician for further evaluation and management, return to the ER if new or worsening symptoms. I have reviewed the patient home medicines and have made adjustments as needed.  Cardiac monitoring/EKG: The patient was maintained on a cardiac monitor.  I personally reviewed and interpreted the cardiac monitor which showed an underlying rhythm of: sinus rhythm.  Additional history obtained: External records from outside source obtained and reviewed including: Chart review including previous notes, labs, imaging.  Consultations obtained:  Disposition Continued outpatient therapy. Follow-up with PCP/orthopedics  recommended for reevaluation of symptoms. Treatment plan discussed with patient.  Pt acknowledged understanding was agreeable to the plan. Worrisome signs and symptoms were discussed with patient, and patient acknowledged understanding to return to the ED if they noticed these signs and symptoms. Patient was stable upon discharge.   This chart was dictated using voice recognition software.  Despite best efforts to proofread,  errors can occur which can change the documentation meaning.          Final Clinical Impression(s) / ED Diagnoses Final diagnoses:  Right knee pain, unspecified chronicity    Rx / DC Orders ED Discharge Orders          Ordered    oxyCODONE-acetaminophen (PERCOCET/ROXICET) 5-325 MG tablet  Every 6 hours PRN        05/02/23 0723    acetaminophen (TYLENOL) 325 MG tablet  Every 6 hours PRN        05/02/23 0723  Jeanelle Malling, PA 05/02/23 0725    Rexford Maus, DO 05/02/23 3086

## 2023-05-02 NOTE — ED Triage Notes (Signed)
Patient reports right knee pain with mild swelling onset yesterday , denies injury or fall .

## 2023-05-02 NOTE — ED Notes (Signed)
Ortho tech called for knee sleeve as there is not one in supply room

## 2023-05-02 NOTE — Discharge Instructions (Addendum)
Please take tylenol or Percocet as needed for pain. I recommend close follow-up with PCP for reevaluation.  Please do not hesitate to return to emergency department if worrisome signs symptoms we discussed become apparent.

## 2023-05-02 NOTE — Progress Notes (Signed)
Orthopedic Tech Progress Note Patient Details:  Olivia Hartman Aug 28, 1933 [id number]  Ortho Devices Type of Ortho Device: Ace wrap Ortho Device/Splint Location: LLE Ortho Device/Splint Interventions: Ordered, Application, Adjustment   Post Interventions Patient Tolerated: Well Attempted knee brace application, but due to edema the medium was too small and the large was too big for her leg, so RLE was wrapped with a  compressive ace wrap. Darleen Crocker 05/02/2023, 7:37 AM
